# Patient Record
Sex: Female | Born: 1985
Health system: Southern US, Community
[De-identification: ages and names within clinical notes are randomized; demographics above are authoritative.]

## PROBLEM LIST (undated history)

## (undated) ENCOUNTER — Inpatient Hospital Stay (HOSPITAL_COMMUNITY): Payer: Self-pay

## (undated) DIAGNOSIS — F329 Major depressive disorder, single episode, unspecified: Secondary | ICD-10-CM

## (undated) DIAGNOSIS — N200 Calculus of kidney: Secondary | ICD-10-CM

## (undated) DIAGNOSIS — Z8619 Personal history of other infectious and parasitic diseases: Secondary | ICD-10-CM

## (undated) DIAGNOSIS — F419 Anxiety disorder, unspecified: Secondary | ICD-10-CM

## (undated) DIAGNOSIS — F32A Depression, unspecified: Secondary | ICD-10-CM

## (undated) DIAGNOSIS — Z8759 Personal history of other complications of pregnancy, childbirth and the puerperium: Secondary | ICD-10-CM

## (undated) HISTORY — PX: NO PAST SURGERIES: SHX2092

## (undated) HISTORY — DX: Personal history of other infectious and parasitic diseases: Z86.19

## (undated) HISTORY — DX: Anxiety disorder, unspecified: F41.9

## (undated) HISTORY — DX: Personal history of other complications of pregnancy, childbirth and the puerperium: Z87.59

---
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
## 2005-01-27 ENCOUNTER — Inpatient Hospital Stay: Admission: AD | Admit: 2005-01-27 | Discharge: 2005-01-27 | Payer: Self-pay | Admitting: Obstetrics

## 2005-03-26 ENCOUNTER — Inpatient Hospital Stay (HOSPITAL_COMMUNITY): Admission: AD | Admit: 2005-03-26 | Discharge: 2005-03-26 | Payer: Self-pay | Admitting: Obstetrics

## 2005-06-12 ENCOUNTER — Inpatient Hospital Stay (HOSPITAL_COMMUNITY): Admission: AD | Admit: 2005-06-12 | Discharge: 2005-06-12 | Payer: Self-pay | Admitting: Obstetrics

## 2005-06-18 ENCOUNTER — Inpatient Hospital Stay (HOSPITAL_COMMUNITY): Admission: AD | Admit: 2005-06-18 | Discharge: 2005-06-18 | Payer: Self-pay | Admitting: Obstetrics

## 2005-07-04 ENCOUNTER — Inpatient Hospital Stay (HOSPITAL_COMMUNITY): Admission: AD | Admit: 2005-07-04 | Discharge: 2005-07-05 | Payer: Self-pay | Admitting: Obstetrics

## 2005-07-28 ENCOUNTER — Inpatient Hospital Stay (HOSPITAL_COMMUNITY): Admission: AD | Admit: 2005-07-28 | Discharge: 2005-07-31 | Payer: Self-pay | Admitting: Obstetrics

## 2005-08-12 ENCOUNTER — Emergency Department (HOSPITAL_COMMUNITY): Admission: EM | Admit: 2005-08-12 | Discharge: 2005-08-12 | Payer: Self-pay | Admitting: Family Medicine

## 2006-10-16 ENCOUNTER — Inpatient Hospital Stay (HOSPITAL_COMMUNITY): Admission: AD | Admit: 2006-10-16 | Discharge: 2006-10-16 | Payer: Self-pay | Admitting: Obstetrics

## 2007-03-08 ENCOUNTER — Inpatient Hospital Stay (HOSPITAL_COMMUNITY): Admission: AD | Admit: 2007-03-08 | Discharge: 2007-03-09 | Payer: Self-pay | Admitting: Obstetrics

## 2007-03-09 ENCOUNTER — Inpatient Hospital Stay (HOSPITAL_COMMUNITY): Admission: AD | Admit: 2007-03-09 | Discharge: 2007-03-09 | Payer: Self-pay | Admitting: Obstetrics

## 2007-03-10 ENCOUNTER — Inpatient Hospital Stay (HOSPITAL_COMMUNITY): Admission: AD | Admit: 2007-03-10 | Discharge: 2007-03-13 | Payer: Self-pay | Admitting: Obstetrics

## 2007-05-16 ENCOUNTER — Emergency Department (HOSPITAL_COMMUNITY): Admission: EM | Admit: 2007-05-16 | Discharge: 2007-05-16 | Payer: Self-pay | Admitting: Emergency Medicine

## 2008-03-23 ENCOUNTER — Emergency Department (HOSPITAL_COMMUNITY): Admission: EM | Admit: 2008-03-23 | Discharge: 2008-03-23 | Payer: Self-pay | Admitting: Emergency Medicine

## 2008-11-23 ENCOUNTER — Ambulatory Visit: Payer: Self-pay | Admitting: Obstetrics and Gynecology

## 2008-11-23 ENCOUNTER — Inpatient Hospital Stay (HOSPITAL_COMMUNITY): Admission: AD | Admit: 2008-11-23 | Discharge: 2008-11-23 | Payer: Self-pay | Admitting: Obstetrics

## 2009-03-21 ENCOUNTER — Inpatient Hospital Stay (HOSPITAL_COMMUNITY): Admission: AD | Admit: 2009-03-21 | Discharge: 2009-03-21 | Payer: Self-pay | Admitting: Obstetrics

## 2009-07-26 ENCOUNTER — Emergency Department (HOSPITAL_COMMUNITY): Admission: EM | Admit: 2009-07-26 | Discharge: 2009-07-26 | Payer: Self-pay | Admitting: Emergency Medicine

## 2009-12-19 ENCOUNTER — Emergency Department (HOSPITAL_COMMUNITY): Admission: EM | Admit: 2009-12-19 | Discharge: 2009-12-20 | Payer: Self-pay | Admitting: Emergency Medicine

## 2010-02-26 ENCOUNTER — Emergency Department (HOSPITAL_COMMUNITY): Admission: EM | Admit: 2010-02-26 | Discharge: 2010-02-26 | Payer: Self-pay | Admitting: Emergency Medicine

## 2010-02-28 ENCOUNTER — Emergency Department (HOSPITAL_COMMUNITY): Admission: EM | Admit: 2010-02-28 | Discharge: 2010-02-28 | Payer: Self-pay | Admitting: Emergency Medicine

## 2010-03-01 ENCOUNTER — Emergency Department (HOSPITAL_COMMUNITY): Admission: EM | Admit: 2010-03-01 | Discharge: 2010-03-01 | Payer: Self-pay | Admitting: Emergency Medicine

## 2010-03-03 ENCOUNTER — Emergency Department (HOSPITAL_COMMUNITY): Admission: EM | Admit: 2010-03-03 | Discharge: 2010-03-03 | Payer: Self-pay | Admitting: Emergency Medicine

## 2010-03-06 ENCOUNTER — Emergency Department (HOSPITAL_COMMUNITY): Admission: EM | Admit: 2010-03-06 | Discharge: 2010-03-06 | Payer: Self-pay | Admitting: Emergency Medicine

## 2010-05-03 ENCOUNTER — Ambulatory Visit: Payer: Self-pay | Admitting: Gynecology

## 2010-05-03 ENCOUNTER — Inpatient Hospital Stay (HOSPITAL_COMMUNITY): Admission: AD | Admit: 2010-05-03 | Discharge: 2010-05-04 | Payer: Self-pay | Admitting: Obstetrics & Gynecology

## 2010-10-08 ENCOUNTER — Emergency Department (HOSPITAL_COMMUNITY)
Admission: EM | Admit: 2010-10-08 | Discharge: 2010-10-08 | Payer: Self-pay | Source: Home / Self Care | Admitting: Emergency Medicine

## 2010-11-13 ENCOUNTER — Inpatient Hospital Stay (HOSPITAL_COMMUNITY)
Admission: AD | Admit: 2010-11-13 | Discharge: 2010-11-13 | Payer: Self-pay | Source: Home / Self Care | Attending: Obstetrics & Gynecology | Admitting: Obstetrics & Gynecology

## 2010-11-14 LAB — CBC
Platelets: 320 10*3/uL (ref 150–400)
RBC: 3.54 MIL/uL — ABNORMAL LOW (ref 3.87–5.11)
WBC: 7.9 10*3/uL (ref 4.0–10.5)

## 2010-11-14 LAB — URINALYSIS, ROUTINE W REFLEX MICROSCOPIC
Nitrite: NEGATIVE
Protein, ur: NEGATIVE mg/dL
Urine Glucose, Fasting: NEGATIVE mg/dL
Urobilinogen, UA: 0.2 mg/dL (ref 0.0–1.0)

## 2010-11-14 LAB — DIFFERENTIAL
Basophils Absolute: 0 10*3/uL (ref 0.0–0.1)
Basophils Relative: 0 % (ref 0–1)
Eosinophils Absolute: 0 10*3/uL (ref 0.0–0.7)
Neutrophils Relative %: 63 % (ref 43–77)

## 2010-11-14 LAB — HEPATITIS B SURFACE ANTIGEN: Hepatitis B Surface Ag: NEGATIVE

## 2010-11-14 LAB — COMPREHENSIVE METABOLIC PANEL
AST: 22 U/L (ref 0–37)
Albumin: 2.4 g/dL — ABNORMAL LOW (ref 3.5–5.2)
Alkaline Phosphatase: 112 U/L (ref 39–117)
Chloride: 104 mEq/L (ref 96–112)
GFR calc Af Amer: >60 mL/min (ref >60)
Potassium: 3.9 mEq/L (ref 3.5–5.1)
Total Bilirubin: 0.4 mg/dL (ref 0.3–1.2)
Total Protein: 5.9 g/dL — ABNORMAL LOW (ref 6.0–8.3)

## 2010-11-14 LAB — RAPID URINE DRUG SCREEN, HOSP PERFORMED
Amphetamines: NOT DETECTED
Cocaine: POSITIVE — AB
Tetrahydrocannabinol: POSITIVE — AB

## 2010-11-14 LAB — RPR: RPR Ser Ql: NONREACTIVE

## 2010-11-14 LAB — GC/CHLAMYDIA PROBE AMP, GENITAL: GC Probe Amp, Genital: NEGATIVE

## 2010-11-14 LAB — WET PREP, GENITAL: Clue Cells Wet Prep HPF POC: NONE SEEN

## 2010-11-15 LAB — STREP B DNA PROBE: Strep Group B Ag: POSITIVE

## 2010-12-24 ENCOUNTER — Inpatient Hospital Stay (HOSPITAL_COMMUNITY)
Admission: AD | Admit: 2010-12-24 | Discharge: 2010-12-25 | Disposition: A | Discharge status: Home / Self Care | Payer: Medicaid Other | Source: Ambulatory Visit | Attending: Obstetrics & Gynecology | Admitting: Obstetrics & Gynecology

## 2010-12-24 DIAGNOSIS — O479 False labor, unspecified: Secondary | ICD-10-CM

## 2010-12-24 LAB — CBC
MCH: 29 pg (ref 26.0–34.0)
MCHC: 32.6 g/dL (ref 30.0–36.0)
Platelets: 313 10*3/uL (ref 150–400)
RBC: 4 MIL/uL (ref 3.87–5.11)

## 2010-12-24 LAB — DIFFERENTIAL
Basophils Absolute: 0 10*3/uL (ref 0.0–0.1)
Basophils Relative: 0 % (ref 0–1)
Eosinophils Absolute: 0.1 10*3/uL (ref 0.0–0.7)
Monocytes Relative: 7 % (ref 3–12)
Neutrophils Relative %: 57 % (ref 43–77)

## 2010-12-25 ENCOUNTER — Inpatient Hospital Stay (HOSPITAL_COMMUNITY): Payer: Medicaid Other

## 2010-12-25 ENCOUNTER — Inpatient Hospital Stay (HOSPITAL_COMMUNITY)
Admission: AD | Admit: 2010-12-25 | Discharge: 2010-12-28 | DRG: 775 | Disposition: A | Discharge status: Home / Self Care | Payer: Medicaid Other | Source: Ambulatory Visit | Attending: Obstetrics & Gynecology | Admitting: Obstetrics & Gynecology

## 2010-12-25 DIAGNOSIS — O99892 Other specified diseases and conditions complicating childbirth: Principal | ICD-10-CM | POA: Diagnosis present

## 2010-12-25 DIAGNOSIS — Z22330 Carrier of Group B streptococcus: Secondary | ICD-10-CM

## 2010-12-25 LAB — RAPID URINE DRUG SCREEN, HOSP PERFORMED
Amphetamines: NOT DETECTED
Amphetamines: NOT DETECTED
Cocaine: NOT DETECTED
Opiates: NOT DETECTED
Tetrahydrocannabinol: POSITIVE — AB
Tetrahydrocannabinol: POSITIVE — AB

## 2010-12-25 LAB — CBC
HCT: 36 % (ref 36.0–46.0)
Hemoglobin: 11.8 g/dL — ABNORMAL LOW (ref 12.0–15.0)
MCH: 28.8 pg (ref 26.0–34.0)
MCHC: 32.8 g/dL (ref 30.0–36.0)
MCV: 87.8 fL (ref 78.0–100.0)

## 2010-12-25 LAB — TYPE AND SCREEN
ABO/RH(D): A POS
Antibody Screen: NEGATIVE

## 2010-12-26 DIAGNOSIS — Z22330 Carrier of Group B streptococcus: Secondary | ICD-10-CM

## 2010-12-26 DIAGNOSIS — O9989 Other specified diseases and conditions complicating pregnancy, childbirth and the puerperium: Secondary | ICD-10-CM

## 2010-12-26 LAB — RPR: RPR Ser Ql: NONREACTIVE

## 2011-01-05 LAB — COMPREHENSIVE METABOLIC PANEL
AST: 52 U/L — ABNORMAL HIGH (ref 0–37)
Albumin: 4.1 g/dL (ref 3.5–5.2)
Chloride: 105 mEq/L (ref 96–112)
Creatinine, Ser: 0.71 mg/dL (ref 0.4–1.2)
GFR calc Af Amer: >60 mL/min (ref >60)
Potassium: 3.8 mEq/L (ref 3.5–5.1)
Total Bilirubin: 0.5 mg/dL (ref 0.3–1.2)

## 2011-01-05 LAB — CBC
MCH: 28.9 pg (ref 26.0–34.0)
Platelets: 201 10*3/uL (ref 150–400)
RBC: 4.22 MIL/uL (ref 3.87–5.11)
WBC: 8.8 10*3/uL (ref 4.0–10.5)

## 2011-01-06 LAB — URINALYSIS, ROUTINE W REFLEX MICROSCOPIC
Glucose, UA: NEGATIVE mg/dL
Ketones, ur: NEGATIVE mg/dL
pH: 5.5 (ref 5.0–8.0)

## 2011-01-06 LAB — POCT PREGNANCY, URINE: Preg Test, Ur: POSITIVE

## 2011-01-06 LAB — WET PREP, GENITAL
Clue Cells Wet Prep HPF POC: NONE SEEN
Trich, Wet Prep: NONE SEEN

## 2011-01-06 LAB — RAPID URINE DRUG SCREEN, HOSP PERFORMED
Barbiturates: NOT DETECTED
Benzodiazepines: NOT DETECTED
Cocaine: POSITIVE — AB

## 2011-01-07 LAB — DIFFERENTIAL
Basophils Relative: 0 % (ref 0–1)
Lymphs Abs: 2.5 10*3/uL (ref 0.7–4.0)
Monocytes Relative: 5 % (ref 3–12)
Neutro Abs: 3.6 10*3/uL (ref 1.7–7.7)
Neutrophils Relative %: 56 % (ref 43–77)

## 2011-01-07 LAB — BASIC METABOLIC PANEL
Calcium: 8.9 mg/dL (ref 8.4–10.5)
Creatinine, Ser: 0.83 mg/dL (ref 0.4–1.2)
GFR calc Af Amer: >60 mL/min (ref >60)
GFR calc non Af Amer: >60 mL/min (ref >60)

## 2011-01-07 LAB — RAPID URINE DRUG SCREEN, HOSP PERFORMED
Amphetamines: NOT DETECTED
Barbiturates: NOT DETECTED
Cocaine: POSITIVE — AB
Opiates: POSITIVE — AB
Tetrahydrocannabinol: POSITIVE — AB

## 2011-01-07 LAB — POCT PREGNANCY, URINE: Preg Test, Ur: NEGATIVE

## 2011-01-07 LAB — TRICYCLICS SCREEN, URINE: TCA Scrn: NOT DETECTED

## 2011-01-07 LAB — ETHANOL: Alcohol, Ethyl (B): <5 mg/dL (ref 0–10)

## 2011-01-07 LAB — CBC
RBC: 4.39 MIL/uL (ref 3.87–5.11)
WBC: 6.5 10*3/uL (ref 4.0–10.5)

## 2011-01-08 LAB — BASIC METABOLIC PANEL
CO2: 27 mEq/L (ref 19–32)
CO2: 29 mEq/L (ref 19–32)
Chloride: 102 mEq/L (ref 96–112)
Chloride: 106 mEq/L (ref 96–112)
GFR calc Af Amer: >60 mL/min (ref >60)
GFR calc Af Amer: >60 mL/min (ref >60)
Potassium: 3.6 mEq/L (ref 3.5–5.1)
Sodium: 135 mEq/L (ref 135–145)
Sodium: 139 mEq/L (ref 135–145)

## 2011-01-08 LAB — URINALYSIS, ROUTINE W REFLEX MICROSCOPIC
Bilirubin Urine: NEGATIVE
Glucose, UA: NEGATIVE mg/dL
Hgb urine dipstick: NEGATIVE
Nitrite: NEGATIVE
Nitrite: POSITIVE — AB
Protein, ur: NEGATIVE mg/dL
Specific Gravity, Urine: 1.011 (ref 1.005–1.030)
Specific Gravity, Urine: 1.024 (ref 1.005–1.030)
Urobilinogen, UA: 2 mg/dL — ABNORMAL HIGH (ref 0.0–1.0)
pH: 7 (ref 5.0–8.0)
pH: 7 (ref 5.0–8.0)

## 2011-01-08 LAB — CBC
HCT: 39 % (ref 36.0–46.0)
Hemoglobin: 12.9 g/dL (ref 12.0–15.0)
Hemoglobin: 13.1 g/dL (ref 12.0–15.0)
MCHC: 33.7 g/dL (ref 30.0–36.0)
MCHC: 33.9 g/dL (ref 30.0–36.0)
MCV: 84.8 fL (ref 78.0–100.0)
MCV: 85.3 fL (ref 78.0–100.0)
RBC: 4.47 MIL/uL (ref 3.87–5.11)
RBC: 4.57 MIL/uL (ref 3.87–5.11)
WBC: 9 10*3/uL (ref 4.0–10.5)

## 2011-01-08 LAB — RAPID URINE DRUG SCREEN, HOSP PERFORMED
Amphetamines: NOT DETECTED
Barbiturates: NOT DETECTED
Barbiturates: NOT DETECTED
Benzodiazepines: NOT DETECTED
Benzodiazepines: POSITIVE — AB
Cocaine: POSITIVE — AB
Cocaine: POSITIVE — AB
Cocaine: POSITIVE — AB
Opiates: POSITIVE — AB
Tetrahydrocannabinol: POSITIVE — AB

## 2011-01-08 LAB — COMPREHENSIVE METABOLIC PANEL
AST: 37 U/L (ref 0–37)
Albumin: 4 g/dL (ref 3.5–5.2)
BUN: 7 mg/dL (ref 6–23)
Calcium: 9.6 mg/dL (ref 8.4–10.5)
Creatinine, Ser: 0.86 mg/dL (ref 0.4–1.2)
GFR calc Af Amer: >60 mL/min (ref >60)
Total Bilirubin: 0.5 mg/dL (ref 0.3–1.2)
Total Protein: 8.2 g/dL (ref 6.0–8.3)

## 2011-01-08 LAB — HEPATIC FUNCTION PANEL
ALT: 73 U/L — ABNORMAL HIGH (ref 0–35)
Alkaline Phosphatase: 76 U/L (ref 39–117)
Bilirubin, Direct: <0.1 mg/dL (ref 0.0–0.3)
Total Protein: 7.3 g/dL (ref 6.0–8.3)

## 2011-01-08 LAB — DIFFERENTIAL
Basophils Relative: 1 % (ref 0–1)
Eosinophils Relative: 2 % (ref 0–5)
Lymphs Abs: 3.5 10*3/uL (ref 0.7–4.0)
Monocytes Absolute: 0.5 10*3/uL (ref 0.1–1.0)

## 2011-01-08 LAB — POCT PREGNANCY, URINE
Preg Test, Ur: NEGATIVE
Preg Test, Ur: NEGATIVE

## 2011-01-08 LAB — WET PREP, GENITAL: Yeast Wet Prep HPF POC: NONE SEEN

## 2011-01-08 LAB — ETHANOL
Alcohol, Ethyl (B): <5 mg/dL (ref 0–10)
Alcohol, Ethyl (B): <5 mg/dL (ref 0–10)

## 2011-01-08 LAB — URINE MICROSCOPIC-ADD ON

## 2011-01-13 LAB — DIFFERENTIAL
Lymphocytes Relative: 46 % (ref 12–46)
Lymphs Abs: 3.6 10*3/uL (ref 0.7–4.0)
Neutrophils Relative %: 44 % (ref 43–77)

## 2011-01-13 LAB — URINALYSIS, ROUTINE W REFLEX MICROSCOPIC
Glucose, UA: NEGATIVE mg/dL
Ketones, ur: NEGATIVE mg/dL
Nitrite: NEGATIVE
Specific Gravity, Urine: 1.025 (ref 1.005–1.030)
pH: 6 (ref 5.0–8.0)

## 2011-01-13 LAB — RAPID URINE DRUG SCREEN, HOSP PERFORMED
Cocaine: POSITIVE — AB
Tetrahydrocannabinol: POSITIVE — AB

## 2011-01-13 LAB — ETHANOL: Alcohol, Ethyl (B): <5 mg/dL (ref 0–10)

## 2011-01-13 LAB — CBC
Platelets: 221 10*3/uL (ref 150–400)
RBC: 4.41 MIL/uL (ref 3.87–5.11)
WBC: 7.8 10*3/uL (ref 4.0–10.5)

## 2011-01-13 LAB — URINE MICROSCOPIC-ADD ON

## 2011-01-13 LAB — BASIC METABOLIC PANEL
BUN: 14 mg/dL (ref 6–23)
Creatinine, Ser: 0.87 mg/dL (ref 0.4–1.2)
GFR calc Af Amer: >60 mL/min (ref >60)
GFR calc non Af Amer: >60 mL/min (ref >60)
Potassium: 3.7 mEq/L (ref 3.5–5.1)

## 2011-01-13 LAB — POCT PREGNANCY, URINE: Preg Test, Ur: NEGATIVE

## 2011-01-21 ENCOUNTER — Encounter: Payer: Medicaid Other | Admitting: Obstetrics & Gynecology

## 2011-01-22 LAB — COMPREHENSIVE METABOLIC PANEL
ALT: 29 U/L (ref 0–35)
AST: 27 U/L (ref 0–37)
Albumin: 2.6 g/dL — ABNORMAL LOW (ref 3.5–5.2)
Alkaline Phosphatase: 153 U/L — ABNORMAL HIGH (ref 39–117)
BUN: 6 mg/dL (ref 6–23)
Chloride: 103 mEq/L (ref 96–112)
GFR calc Af Amer: >60 mL/min (ref >60)
Potassium: 3.5 mEq/L (ref 3.5–5.1)
Sodium: 133 mEq/L — ABNORMAL LOW (ref 135–145)
Total Bilirubin: 0.6 mg/dL (ref 0.3–1.2)
Total Protein: 6.5 g/dL (ref 6.0–8.3)

## 2011-01-22 LAB — URINALYSIS, ROUTINE W REFLEX MICROSCOPIC
Hgb urine dipstick: NEGATIVE
Protein, ur: NEGATIVE mg/dL
Specific Gravity, Urine: 1.025 (ref 1.005–1.030)
Urobilinogen, UA: 0.2 mg/dL (ref 0.0–1.0)

## 2011-01-22 LAB — CBC
MCV: 89 fL (ref 78.0–100.0)
Platelets: 315 10*3/uL (ref 150–400)
RBC: 4.01 MIL/uL (ref 3.87–5.11)
RDW: 13.8 % (ref 11.5–15.5)
WBC: 10.8 10*3/uL — ABNORMAL HIGH (ref 4.0–10.5)

## 2011-01-22 LAB — PROTEIN / CREATININE RATIO, URINE: Creatinine, Urine: 124 mg/dL

## 2011-01-28 LAB — URINALYSIS, ROUTINE W REFLEX MICROSCOPIC
Glucose, UA: NEGATIVE mg/dL
Specific Gravity, Urine: 1.01 (ref 1.005–1.030)
pH: 7 (ref 5.0–8.0)

## 2011-01-28 LAB — GC/CHLAMYDIA PROBE AMP, GENITAL
Chlamydia, DNA Probe: NEGATIVE
GC Probe Amp, Genital: NEGATIVE

## 2011-01-28 LAB — WET PREP, GENITAL
Trich, Wet Prep: NONE SEEN
Yeast Wet Prep HPF POC: NONE SEEN

## 2011-02-05 LAB — CBC
HCT: 35.8 % — ABNORMAL LOW (ref 36.0–46.0)
MCV: 86.9 fL (ref 78.0–100.0)
Platelets: 173 10*3/uL (ref 150–400)
RDW: 13.7 % (ref 11.5–15.5)
WBC: 5.8 10*3/uL (ref 4.0–10.5)

## 2011-04-04 ENCOUNTER — Emergency Department (HOSPITAL_COMMUNITY)
Admission: EM | Admit: 2011-04-04 | Discharge: 2011-04-04 | Payer: Medicaid Other | Attending: Emergency Medicine | Admitting: Emergency Medicine

## 2011-04-04 ENCOUNTER — Emergency Department (HOSPITAL_COMMUNITY): Payer: Medicaid Other

## 2011-04-04 DIAGNOSIS — S335XXA Sprain of ligaments of lumbar spine, initial encounter: Secondary | ICD-10-CM | POA: Insufficient documentation

## 2011-04-04 DIAGNOSIS — Y92410 Unspecified street and highway as the place of occurrence of the external cause: Secondary | ICD-10-CM | POA: Insufficient documentation

## 2011-04-04 DIAGNOSIS — S139XXA Sprain of joints and ligaments of unspecified parts of neck, initial encounter: Secondary | ICD-10-CM | POA: Insufficient documentation

## 2011-04-04 DIAGNOSIS — IMO0002 Reserved for concepts with insufficient information to code with codable children: Secondary | ICD-10-CM | POA: Insufficient documentation

## 2011-04-04 DIAGNOSIS — N390 Urinary tract infection, site not specified: Secondary | ICD-10-CM | POA: Insufficient documentation

## 2011-04-04 LAB — URINE MICROSCOPIC-ADD ON

## 2011-04-04 LAB — URINALYSIS, ROUTINE W REFLEX MICROSCOPIC
Glucose, UA: NEGATIVE mg/dL
pH: 5.5 (ref 5.0–8.0)

## 2011-04-06 LAB — URINE CULTURE: Culture  Setup Time: 201206142116

## 2011-07-18 LAB — POCT RAPID STREP A: Streptococcus, Group A Screen (Direct): NEGATIVE

## 2012-11-10 IMAGING — US US OB COMP +14 WK
2 series · 12 of 28 positions shown · non-contrast
Comparison: none

[Series 1: us ob comp +14 wk · 2 of 7 slices shown (1 of 2)]
[im 3/7]
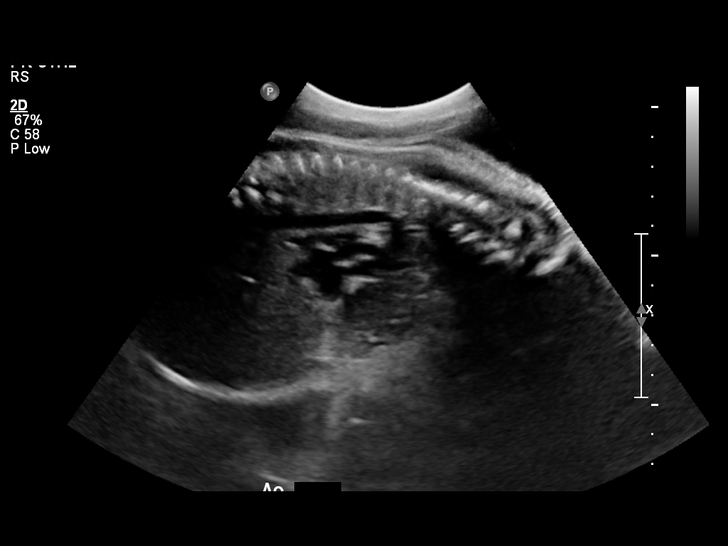
[im 7/7]
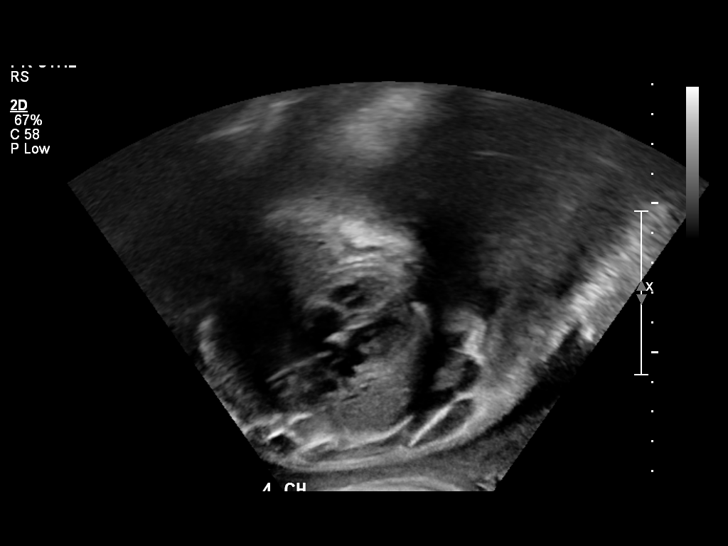

[Series 1: us ob comp +14 wk · 10 of 45 slices shown (2 of 2)]
[im 2/45]
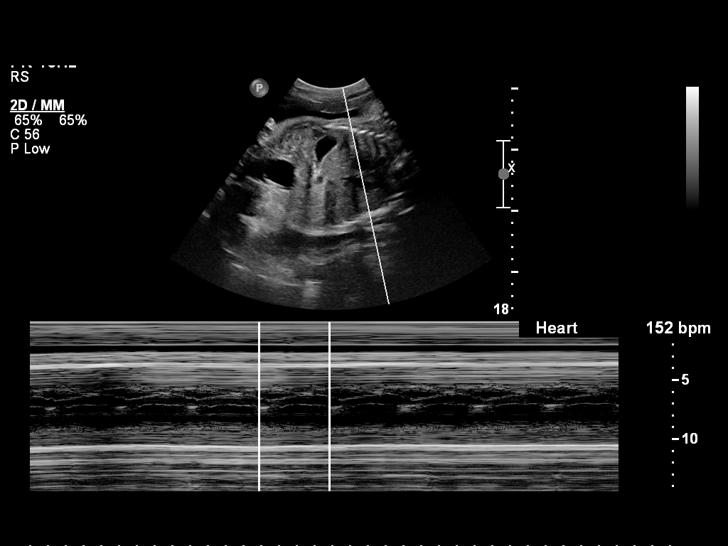
[im 8/45]
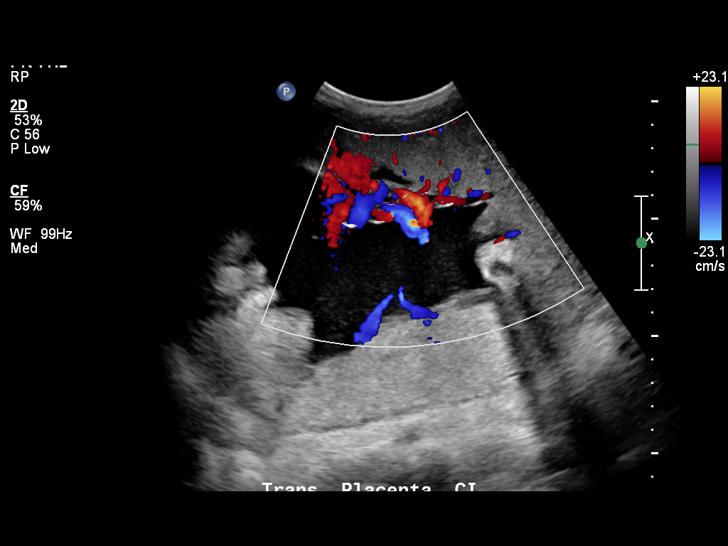
[im 12/45]
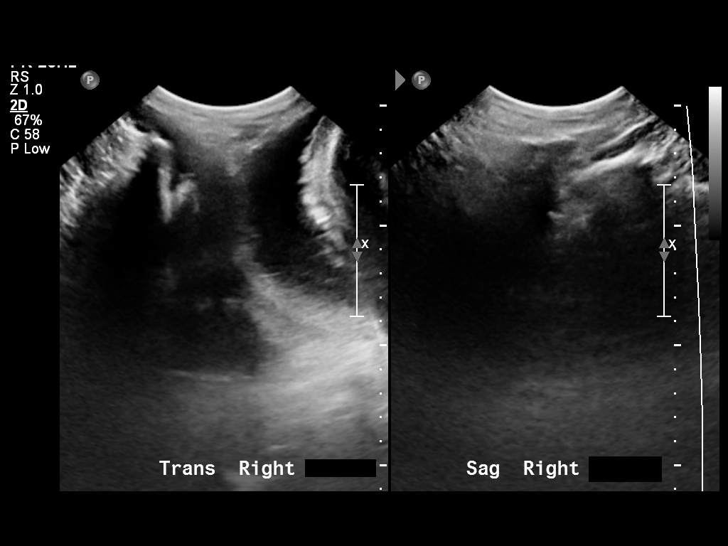
[im 16/45]
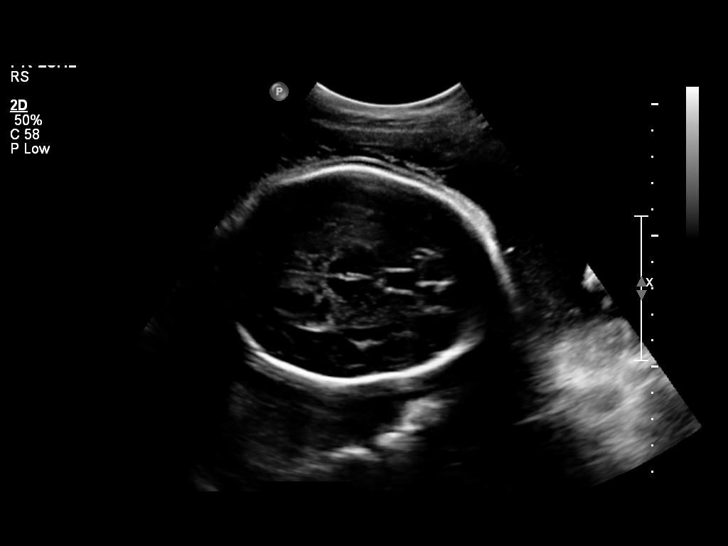
[im 22/45]
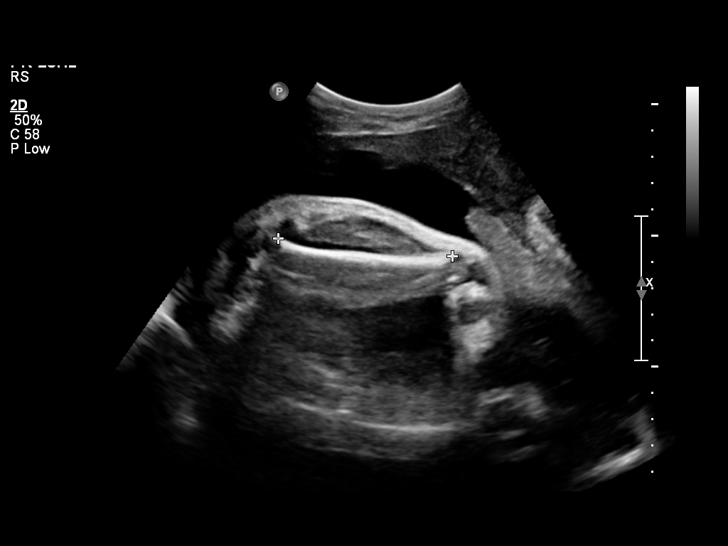
[im 25/45]
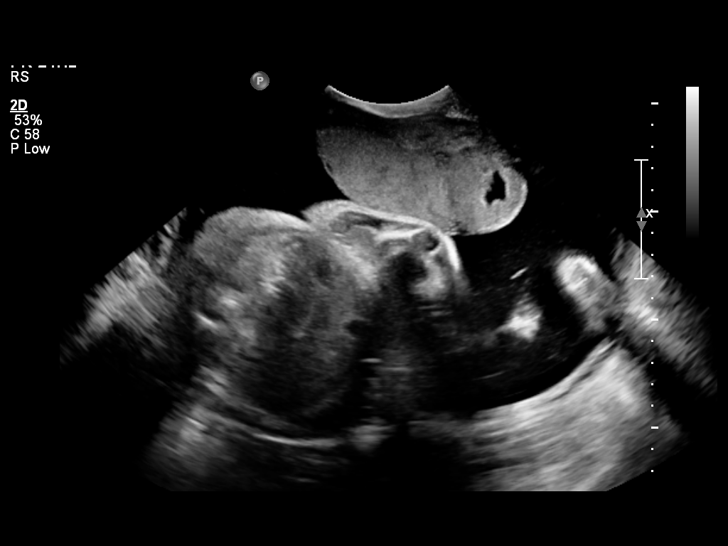
[im 29/45]
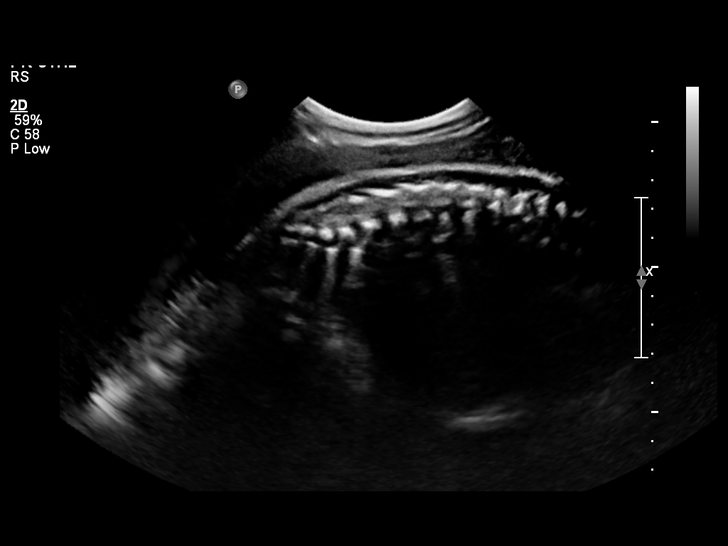
[im 35/45]
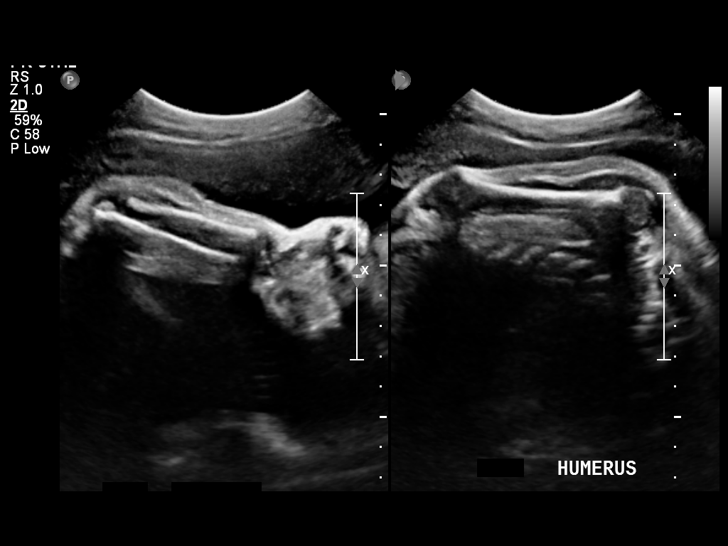
[im 39/45]
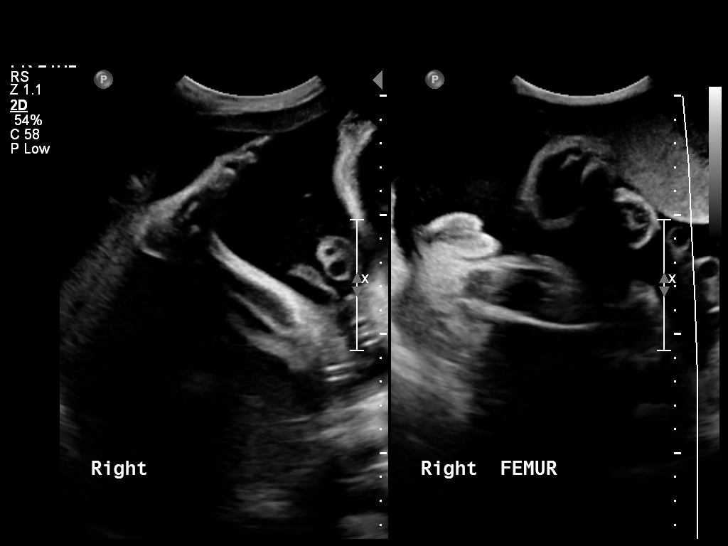
[im 43/45]
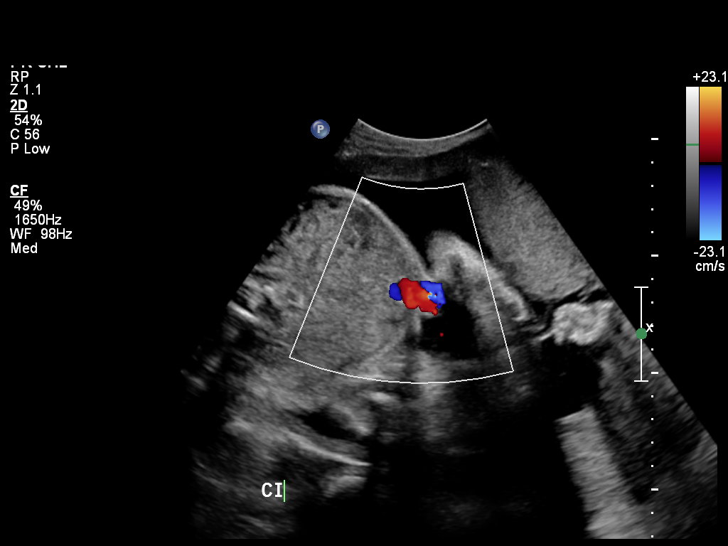

[12 of 28 positions shown; findings below may reference images not displayed]

OBSTETRICS REPORT
                      (Signed Final 11/13/2010 [DATE])

Procedures

 US OB COMP +14 WK                                     76805.1
Indications

 Contracting
 No or Little Prenatal Care
 Routine fetal anatomic survey
Fetal Evaluation

 Fetal Heart Rate:  152                          bpm
 Cardiac Activity:  Observed
 Presentation:      Cephalic
 Placenta:          Posterior Fundal, above
                    cervical os
 P. Cord            Visualized
 Insertion:

 Amniotic Fluid
 AFI FV:      Subjectively within normal limits
 AFI Sum:     22.46   cm       85  %Tile     Larg Pckt:    6.56  cm
 RUQ:   6.02    cm   RLQ:    4.61   cm    LUQ:   6.56    cm   LLQ:    5.27   cm
Biometry

 BPD:       82  mm     G. Age:  33w 0d                CI:        68.57   70 - 86
                                                      FL/HC:      20.8   19.4 -

 HC:     316.5  mm     G. Age:  35w 4d       50  %    HC/AC:      1.04   0.96 -

 AC:     303.7  mm     G. Age:  34w 3d       59  %    FL/BPD:     80.4   71 - 87
 FL:      65.9  mm     G. Age:  34w 0d       35  %    FL/AC:      21.7   20 - 24

 Est. FW:    0450  gm      5 lb 4 oz     61  %
Gestational Age

 LMP:           34w 1d        Date:  03/19/10                 EDD:   12/24/10
 U/S Today:     34w 2d                                        EDD:   12/23/10
 Best:          34w 1d     Det. By:  LMP  (03/19/10)          EDD:   12/24/10
Anatomy
 Cranium:           Appears normal      Aortic Arch:       Appears normal
 Fetal Cavum:       Appears normal      Ductal Arch:       Not well
                                                           visualized
 Ventricles:        Appears normal      Diaphragm:         Appears normal
 Choroid Plexus:    Appears normal      Stomach:           Appears
                                                           normal, left
                                                           sided
 Cerebellum:        Appears normal      Abdomen:           Appears normal
 Posterior Fossa:   Appears normal      Abdominal Wall:    Appears nml
                                                           (cord insert,
                                                           abd wall)
 Nuchal Fold:       Not applicable      Cord Vessels:      Appears normal
                    (>20 wks GA)                           (3 vessel cord)
 Face:              Lips appear         Kidneys:           Appear normal
                    normal
 Heart:             Appears normal      Bladder:           Appears normal
                    (4 chamber &
                    axis)
 RVOT:              Appears normal      Spine:             Appears normal
 LVOT:              Not well            Limbs:             Appears normal
                    visualized                             (hands, ankles,
                                                           feet)

 Other:     Female gender. Heels visualized. Nasal bone visualized.
Cervix Uterus Adnexa

 Cervical Length:    3.32     cm

 Cervix:       Closed.

 Adnexa:     No abnormality visualized.
Impression

   Single living intrauterine gestation in cephalic presentation
 with concordant gestational age,  fetal indices, and normal
 visualized anatomy.

## 2012-11-19 ENCOUNTER — Emergency Department (HOSPITAL_COMMUNITY)
Admission: EM | Admit: 2012-11-19 | Discharge: 2012-11-19 | Disposition: A | Discharge status: Home / Self Care | Payer: Self-pay

## 2012-11-19 ENCOUNTER — Encounter (HOSPITAL_COMMUNITY): Payer: Self-pay

## 2012-11-19 HISTORY — DX: Calculus of kidney: N20.0

## 2012-11-19 NOTE — ED Notes (Signed)
Patient reports that she began having dysuria symptoms a week ago and is now having lower abdomina, frequency and lower back pain too.

## 2013-02-27 ENCOUNTER — Emergency Department (HOSPITAL_COMMUNITY)
Admission: EM | Admit: 2013-02-27 | Discharge: 2013-02-27 | Discharge status: Against Medical Advice | Payer: Self-pay | Attending: Emergency Medicine | Admitting: Emergency Medicine

## 2013-02-27 ENCOUNTER — Emergency Department (HOSPITAL_COMMUNITY): Payer: Self-pay

## 2013-02-27 ENCOUNTER — Encounter (HOSPITAL_COMMUNITY): Payer: Self-pay | Admitting: *Deleted

## 2013-02-27 DIAGNOSIS — Y939 Activity, unspecified: Secondary | ICD-10-CM | POA: Insufficient documentation

## 2013-02-27 DIAGNOSIS — W1809XA Striking against other object with subsequent fall, initial encounter: Secondary | ICD-10-CM | POA: Insufficient documentation

## 2013-02-27 DIAGNOSIS — W11XXXA Fall on and from ladder, initial encounter: Secondary | ICD-10-CM | POA: Insufficient documentation

## 2013-02-27 DIAGNOSIS — IMO0002 Reserved for concepts with insufficient information to code with codable children: Secondary | ICD-10-CM | POA: Insufficient documentation

## 2013-02-27 DIAGNOSIS — Y929 Unspecified place or not applicable: Secondary | ICD-10-CM | POA: Insufficient documentation

## 2013-02-27 NOTE — ED Notes (Signed)
Patient called no answer.

## 2013-02-27 NOTE — ED Notes (Signed)
Unable to locate patient x3.

## 2013-02-27 NOTE — ED Notes (Signed)
Pt in s/p fall off ladder yesterday approx 3 ft fall, c/o mid and lower back pain, states she hit her head, possible LOC. Pt c/o mild dizziness since that time. Pt ambulatory without difficulty.

## 2013-10-21 NOTE — L&D Delivery Note (Signed)
Delivery Note Pt reached complete dilation and pushed great.  At 4:54 PM a healthy female was delivered via vertex (Presentation:LOA).  APGAR: 8, 9; weight pending .   Placenta status:delivered spontaneously , .  Cord:  with the following complications:  Tight nuchal delivered through .  Anesthesia:  epidural Episiotomy:  none Lacerations:  none Suture Repair: n/a Est. Blood Loss (mL):  300cc  Mom to postpartum.  Baby to Couplet care / Skin to Skin. D/w pt circumcision and desires to do in office. Oliver PilaRICHARDSON,Aynslee Mulhall W 09/07/2014, 5:12 PM

## 2014-05-09 LAB — OB RESULTS CONSOLE GC/CHLAMYDIA
CHLAMYDIA, DNA PROBE: NEGATIVE
Gonorrhea: NEGATIVE

## 2014-05-09 LAB — OB RESULTS CONSOLE HEPATITIS B SURFACE ANTIGEN: HEP B S AG: NEGATIVE

## 2014-05-09 LAB — OB RESULTS CONSOLE ABO/RH: RH TYPE: POSITIVE

## 2014-05-09 LAB — OB RESULTS CONSOLE RUBELLA ANTIBODY, IGM: RUBELLA: IMMUNE

## 2014-05-09 LAB — OB RESULTS CONSOLE RPR: RPR: NONREACTIVE

## 2014-05-09 LAB — OB RESULTS CONSOLE HIV ANTIBODY (ROUTINE TESTING): HIV: NONREACTIVE

## 2014-08-11 LAB — OB RESULTS CONSOLE GBS: GBS: NEGATIVE

## 2014-08-13 ENCOUNTER — Encounter (HOSPITAL_COMMUNITY): Payer: Self-pay | Admitting: *Deleted

## 2014-08-13 ENCOUNTER — Inpatient Hospital Stay (HOSPITAL_COMMUNITY)
Admission: AD | Admit: 2014-08-13 | Discharge: 2014-08-14 | Disposition: A | Discharge status: Home / Self Care | Payer: Medicaid Other | Source: Ambulatory Visit | Attending: Obstetrics and Gynecology | Admitting: Obstetrics and Gynecology

## 2014-08-13 DIAGNOSIS — Z3A36 36 weeks gestation of pregnancy: Secondary | ICD-10-CM | POA: Insufficient documentation

## 2014-08-13 HISTORY — DX: Depression, unspecified: F32.A

## 2014-08-13 HISTORY — DX: Major depressive disorder, single episode, unspecified: F32.9

## 2014-08-13 NOTE — MAU Note (Signed)
Contractions for couple days. Stronger last couple hours. Denies leaking fld or bleeding. Mentioned baby has not moved much today

## 2014-08-14 ENCOUNTER — Encounter (HOSPITAL_COMMUNITY): Payer: Self-pay

## 2014-08-14 DIAGNOSIS — Z3A36 36 weeks gestation of pregnancy: Secondary | ICD-10-CM | POA: Diagnosis not present

## 2014-08-14 NOTE — Discharge Instructions (Signed)
Braxton Hicks Contractions Contractions of the uterus can occur throughout pregnancy. Contractions are not always a sign that you are in labor.  WHAT ARE BRAXTON HICKS CONTRACTIONS?  Contractions that occur before labor are called Braxton Hicks contractions, or false labor. Toward the end of pregnancy (32-34 weeks), these contractions can develop more often and may become more forceful. This is not true labor because these contractions do not result in opening (dilatation) and thinning of the cervix. They are sometimes difficult to tell apart from true labor because these contractions can be forceful and people have different pain tolerances. You should not feel embarrassed if you go to the hospital with false labor. Sometimes, the only way to tell if you are in true labor is for your health care provider to look for changes in the cervix. If there are no prenatal problems or other health problems associated with the pregnancy, it is completely safe to be sent home with false labor and await the onset of true labor. HOW CAN YOU TELL THE DIFFERENCE BETWEEN TRUE AND FALSE LABOR? False Labor  The contractions of false labor are usually shorter and not as hard as those of true labor.   The contractions are usually irregular.   The contractions are often felt in the front of the lower abdomen and in the groin.   The contractions may go away when you walk around or change positions while lying down.   The contractions get weaker and are shorter lasting as time goes on.   The contractions do not usually become progressively stronger, regular, and closer together as with true labor.  True Labor  Contractions in true labor last 30-70 seconds, become very regular, usually become more intense, and increase in frequency.   The contractions do not go away with walking.   The discomfort is usually felt in the top of the uterus and spreads to the lower abdomen and low back.   True labor can be  determined by your health care provider with an exam. This will show that the cervix is dilating and getting thinner.  WHAT TO REMEMBER  Keep up with your usual exercises and follow other instructions given by your health care provider.   Take medicines as directed by your health care provider.   Keep your regular prenatal appointments.   Eat and drink lightly if you think you are going into labor.   If Braxton Hicks contractions are making you uncomfortable:   Change your position from lying down or resting to walking, or from walking to resting.   Sit and rest in a tub of warm water.   Drink 2-3 glasses of water. Dehydration may cause these contractions.   Do slow and deep breathing several times an hour.  WHEN SHOULD I SEEK IMMEDIATE MEDICAL CARE? Seek immediate medical care if:  Your contractions become stronger, more regular, and closer together.   You have fluid leaking or gushing from your vagina.   You have a fever.   You pass blood-tinged mucus.   You have vaginal bleeding.   You have continuous abdominal pain.   You have low back pain that you never had before.   You feel your baby's head pushing down and causing pelvic pressure.   Your baby is not moving as much as it used to.  Document Released: 10/07/2005 Document Revised: 10/12/2013 Document Reviewed: 07/19/2013 [employer] Patient Information 2015 [employer]. This information is not intended to replace advice given to you by your health care   provider. Make sure you discuss any questions you have with your health care provider.  Fetal Movement Counts Patient Name: __________________________________________________ Patient Due Date: ____________________ Performing a fetal movement count is highly recommended in high-risk pregnancies, but it is good for every pregnant woman to do. Your health care provider may ask you to start counting fetal movements at 28 weeks of the pregnancy. Fetal  movements often increase:  After eating a full meal.  After physical activity.  After eating or drinking something sweet or cold.  At rest. Pay attention to when you feel the baby is most active. This will help you notice a pattern of your baby's sleep and wake cycles and what factors contribute to an increase in fetal movement. It is important to perform a fetal movement count at the same time each day when your baby is normally most active.  HOW TO COUNT FETAL MOVEMENTS 1. Find a quiet and comfortable area to sit or lie down on your left side. Lying on your left side provides the best blood and oxygen circulation to your baby. 2. Write down the day and time on a sheet of paper or in a journal. 3. Start counting kicks, flutters, swishes, rolls, or jabs in a 2-hour period. You should feel at least 10 movements within 2 hours. 4. If you do not feel 10 movements in 2 hours, wait 2-3 hours and count again. Look for a change in the pattern or not enough counts in 2 hours. SEEK MEDICAL CARE IF:  You feel less than 10 counts in 2 hours, tried twice.  There is no movement in over an hour.  The pattern is changing or taking longer each day to reach 10 counts in 2 hours.  You feel the baby is not moving as he or she usually does. Date: ____________ Movements: ____________ Start time: ____________ Finish time: ____________  Date: ____________ Movements: ____________ Start time: ____________ Finish time: ____________ Date: ____________ Movements: ____________ Start time: ____________ Finish time: ____________ Date: ____________ Movements: ____________ Start time: ____________ Finish time: ____________ Date: ____________ Movements: ____________ Start time: ____________ Finish time: ____________ Date: ____________ Movements: ____________ Start time: ____________ Finish time: ____________ Date: ____________ Movements: ____________ Start time: ____________ Finish time: ____________ Date: ____________  Movements: ____________ Start time: ____________ Finish time: ____________  Date: ____________ Movements: ____________ Start time: ____________ Finish time: ____________ Date: ____________ Movements: ____________ Start time: ____________ Finish time: ____________ Date: ____________ Movements: ____________ Start time: ____________ Finish time: ____________ Date: ____________ Movements: ____________ Start time: ____________ Finish time: ____________ Date: ____________ Movements: ____________ Start time: ____________ Finish time: ____________ Date: ____________ Movements: ____________ Start time: ____________ Finish time: ____________ Date: ____________ Movements: ____________ Start time: ____________ Finish time: ____________  Date: ____________ Movements: ____________ Start time: ____________ Finish time: ____________ Date: ____________ Movements: ____________ Start time: ____________ Finish time: ____________ Date: ____________ Movements: ____________ Start time: ____________ Finish time: ____________ Date: ____________ Movements: ____________ Start time: ____________ Finish time: ____________ Date: ____________ Movements: ____________ Start time: ____________ Finish time: ____________ Date: ____________ Movements: ____________ Start time: ____________ Finish time: ____________ Date: ____________ Movements: ____________ Start time: ____________ Finish time: ____________  Date: ____________ Movements: ____________ Start time: ____________ Finish time: ____________ Date: ____________ Movements: ____________ Start time: ____________ Finish time: ____________ Date: ____________ Movements: ____________ Start time: ____________ Finish time: ____________ Date: ____________ Movements: ____________ Start time: ____________ Finish time: ____________ Date: ____________ Movements: ____________ Start time: ____________ Finish time: ____________ Date: ____________ Movements: ____________ Start time:  ____________ Finish time: ____________ Date: ____________ Movements:   ____________ Start time: ____________ Finish time: ____________  Date: ____________ Movements: ____________ Start time: ____________ Finish time: ____________ Date: ____________ Movements: ____________ Start time: ____________ Finish time: ____________ Date: ____________ Movements: ____________ Start time: ____________ Finish time: ____________ Date: ____________ Movements: ____________ Start time: ____________ Finish time: ____________ Date: ____________ Movements: ____________ Start time: ____________ Finish time: ____________ Date: ____________ Movements: ____________ Start time: ____________ Finish time: ____________ Date: ____________ Movements: ____________ Start time: ____________ Finish time: ____________  Date: ____________ Movements: ____________ Start time: ____________ Finish time: ____________ Date: ____________ Movements: ____________ Start time: ____________ Finish time: ____________ Date: ____________ Movements: ____________ Start time: ____________ Finish time: ____________ Date: ____________ Movements: ____________ Start time: ____________ Finish time: ____________ Date: ____________ Movements: ____________ Start time: ____________ Finish time: ____________ Date: ____________ Movements: ____________ Start time: ____________ Finish time: ____________ Date: ____________ Movements: ____________ Start time: ____________ Finish time: ____________  Date: ____________ Movements: ____________ Start time: ____________ Finish time: ____________ Date: ____________ Movements: ____________ Start time: ____________ Finish time: ____________ Date: ____________ Movements: ____________ Start time: ____________ Finish time: ____________ Date: ____________ Movements: ____________ Start time: ____________ Finish time: ____________ Date: ____________ Movements: ____________ Start time: ____________ Finish time: ____________ Date:  ____________ Movements: ____________ Start time: ____________ Finish time: ____________ Date: ____________ Movements: ____________ Start time: ____________ Finish time: ____________  Date: ____________ Movements: ____________ Start time: ____________ Finish time: ____________ Date: ____________ Movements: ____________ Start time: ____________ Finish time: ____________ Date: ____________ Movements: ____________ Start time: ____________ Finish time: ____________ Date: ____________ Movements: ____________ Start time: ____________ Finish time: ____________ Date: ____________ Movements: ____________ Start time: ____________ Finish time: ____________ Date: ____________ Movements: ____________ Start time: ____________ Finish time: ____________ Document Released: 11/06/2006 Document Revised: 02/21/2014 Document Reviewed: 08/03/2012 [employer] Patient Information 2015 [employer]. This information is not intended to replace advice given to you by your health care provider. Make sure you discuss any questions you have with your health care provider.  

## 2014-08-22 ENCOUNTER — Encounter (HOSPITAL_COMMUNITY): Payer: Self-pay

## 2014-09-05 ENCOUNTER — Encounter (HOSPITAL_COMMUNITY): Payer: Self-pay | Admitting: *Deleted

## 2014-09-05 ENCOUNTER — Telehealth (HOSPITAL_COMMUNITY): Payer: Self-pay | Admitting: *Deleted

## 2014-09-05 NOTE — Telephone Encounter (Signed)
Preadmission screen  

## 2014-09-06 NOTE — H&P (Signed)
Sonya Flores is a 28 y.o. female G5P4004 at 1939 2/7 weeks (EDD 09/12/14 by LMP c/w 15 week US) presenting for IOL at term with favorable cervix. Prenatal care started late about 15 weeks and pt admitted to a history of alcohol abuse just prior to finding out she was pregnant.  She also had a h/o drug abuse, but not in last year.  Her other children were adopted out because of her drug issue and she sees them but does not raise them.  She quit alcohol and smoking with this pregnancy and very much desires this child.  She has been motivated and states no recent drug use.  She had a lot of anxiety and irritability early in pregnancy and has responded well to zoloft after we discussed risks and benefits of this.   She missed appointments from 27 to 34 weeks when [place] had to go to [place] when her aunt and aunt's husband were killed in an MVA.  They have a 28y/o and 28 y/o that she and her mother are hoping to adopt.  It has been very stressful, but she states she and husband have not been drinking or using drugs.  Maternal Medical History:  Contractions: Frequency: irregular.   Perceived severity is mild.    Fetal activity: Perceived fetal activity is normal.    Prenatal Complications - Diabetes: none.    OB History    Gravida Para Term Preterm AB TAB SAB Ectopic Multiple Living   5 4 4  0 0 0 0 0 0 4    2005 6#2oz NSVD 2006 6#5oz NSVD 2008 6#  NSVD 2012 6# NSVD  Past Medical History  Diagnosis Date  . Kidney stone   . Depression   . History of shingles    Past Surgical History  Procedure Laterality Date  . No past surgeries     Family History: family history is not on file. Social History:  reports that she quit smoking about 6 months ago. She has never used smokeless tobacco. She reports that she does not drink alcohol or use illicit drugs.   Prenatal Transfer Tool  Maternal Diabetes: No Genetic Screening: Declined Maternal Ultrasounds/Referrals: Normal Fetal  Ultrasounds or other Referrals:  None Maternal Substance Abuse:  No  Former smoker and alcohol/drugs Significant Maternal Medications:  Meds include: Zoloft Significant Maternal Lab Results:  None Other Comments:  None  ROS    There were no vitals taken for this visit. Maternal Exam:  Uterine Assessment: Contraction strength is mild.  Contraction frequency is irregular.   Abdomen: Patient reports no abdominal tenderness. Fetal presentation: vertex  Introitus: Normal vulva. Normal vagina.    Physical Exam  Constitutional: She appears well-developed and well-nourished.  Cardiovascular: Normal rate and regular rhythm.   Respiratory: Effort normal.  GI: Soft.  Genitourinary: Vagina normal and uterus normal.  Psychiatric: She has a normal mood and affect. Her behavior is normal.    Prenatal labs: ABO, Rh: A/Positive/-- (07/20 0000) Antibody:  Negative Rubella: Immune (07/20 0000) RPR: Nonreactive (07/20 0000)  HBsAg: Negative (07/20 0000)  HIV: Non-reactive (07/20 0000)  GBS: Negative (10/22 0000)  One hour 99 CF negative  Assessment/Plan: Pt for IOL at term with h/o drug abuse, clean now.  Other kids adopted out due to her drug history.  Will get UDS on baby and SW consult.  Also, difficult time with deaths of  Aunt and uncle recently, leaving two small children that need a home.  Plan pitocin and AROM.  Oliver PilaICHARDSON,Sinda Leedom W 09/06/2014, 6:28 PM

## 2014-09-07 ENCOUNTER — Encounter (HOSPITAL_COMMUNITY): Payer: Self-pay | Admitting: Certified Registered Nurse Anesthetist

## 2014-09-07 ENCOUNTER — Encounter (HOSPITAL_COMMUNITY): Payer: Self-pay

## 2014-09-07 ENCOUNTER — Inpatient Hospital Stay (HOSPITAL_COMMUNITY): Payer: Medicaid Other | Admitting: Anesthesiology

## 2014-09-07 ENCOUNTER — Inpatient Hospital Stay (HOSPITAL_COMMUNITY)
Admission: RE | Admit: 2014-09-07 | Discharge: 2014-09-09 | DRG: 775 | Disposition: A | Discharge status: Home / Self Care | Payer: Medicaid Other | Source: Ambulatory Visit | Attending: Obstetrics and Gynecology | Admitting: Obstetrics and Gynecology

## 2014-09-07 DIAGNOSIS — Z87898 Personal history of other specified conditions: Secondary | ICD-10-CM

## 2014-09-07 DIAGNOSIS — Z87891 Personal history of nicotine dependence: Secondary | ICD-10-CM

## 2014-09-07 DIAGNOSIS — Z3A39 39 weeks gestation of pregnancy: Secondary | ICD-10-CM | POA: Diagnosis present

## 2014-09-07 DIAGNOSIS — Z87442 Personal history of urinary calculi: Secondary | ICD-10-CM | POA: Diagnosis not present

## 2014-09-07 DIAGNOSIS — O9989 Other specified diseases and conditions complicating pregnancy, childbirth and the puerperium: Secondary | ICD-10-CM | POA: Diagnosis present

## 2014-09-07 LAB — RAPID URINE DRUG SCREEN, HOSP PERFORMED
AMPHETAMINES: NOT DETECTED
Barbiturates: NOT DETECTED
Benzodiazepines: NOT DETECTED
Cocaine: NOT DETECTED
Opiates: NOT DETECTED
Tetrahydrocannabinol: NOT DETECTED

## 2014-09-07 LAB — RPR

## 2014-09-07 LAB — CBC
HCT: 31.4 % — ABNORMAL LOW (ref 36.0–46.0)
Hemoglobin: 10.3 g/dL — ABNORMAL LOW (ref 12.0–15.0)
MCH: 29.9 pg (ref 26.0–34.0)
MCHC: 32.8 g/dL (ref 30.0–36.0)
MCV: 91 fL (ref 78.0–100.0)
PLATELETS: 190 10*3/uL (ref 150–400)
RBC: 3.45 MIL/uL — ABNORMAL LOW (ref 3.87–5.11)
RDW: 13.4 % (ref 11.5–15.5)
WBC: 8.9 10*3/uL (ref 4.0–10.5)

## 2014-09-07 LAB — TYPE AND SCREEN
ABO/RH(D): A POS
Antibody Screen: NEGATIVE

## 2014-09-07 MED ORDER — FENTANYL 2.5 MCG/ML BUPIVACAINE 1/10 % EPIDURAL INFUSION (WH - ANES)
14.0000 mL/h | INTRAMUSCULAR | Status: DC | PRN
  Administered 2014-09-07: 14 mL/h via EPIDURAL

## 2014-09-07 MED ORDER — OXYTOCIN BOLUS FROM INFUSION: 500.0000 mL | INTRAVENOUS | Status: DC

## 2014-09-07 MED ORDER — WITCH HAZEL-GLYCERIN EX PADS: 1.0000 "application " | MEDICATED_PAD | CUTANEOUS | Status: DC | PRN

## 2014-09-07 MED ORDER — OXYCODONE-ACETAMINOPHEN 5-325 MG PO TABS
2.0000 | ORAL_TABLET | ORAL | Status: DC | PRN
Start: 2014-09-07 — End: 2014-09-09

## 2014-09-07 MED ORDER — TERBUTALINE SULFATE 1 MG/ML IJ SOLN: 0.2500 mg | Freq: Once | INTRAMUSCULAR | Status: DC | PRN

## 2014-09-07 MED ORDER — CITRIC ACID-SODIUM CITRATE 334-500 MG/5ML PO SOLN: 30.0000 mL | ORAL | Status: DC | PRN

## 2014-09-07 MED ORDER — IBUPROFEN 600 MG PO TABS
600.0000 mg | ORAL_TABLET | Freq: Four times a day (QID) | ORAL | Status: DC
  Administered 2014-09-07 – 2014-09-09 (×8): 600 mg via ORAL
  Filled 2014-09-07 (×8): qty 1

## 2014-09-07 MED ORDER — ONDANSETRON HCL 4 MG/2ML IJ SOLN: 4.0000 mg | Freq: Four times a day (QID) | INTRAMUSCULAR | Status: DC | PRN

## 2014-09-07 MED ORDER — OXYCODONE-ACETAMINOPHEN 5-325 MG PO TABS: 1.0000 | ORAL_TABLET | ORAL | Status: DC | PRN

## 2014-09-07 MED ORDER — OXYTOCIN 40 UNITS IN LACTATED RINGERS INFUSION - SIMPLE MED
62.5000 mL/h | INTRAVENOUS | Status: DC
  Administered 2014-09-07: 62.5 mL/h via INTRAVENOUS

## 2014-09-07 MED ORDER — LANOLIN HYDROUS EX OINT: TOPICAL_OINTMENT | CUTANEOUS | Status: DC | PRN

## 2014-09-07 MED ORDER — DIPHENHYDRAMINE HCL 25 MG PO CAPS: 25.0000 mg | ORAL_CAPSULE | Freq: Four times a day (QID) | ORAL | Status: DC | PRN

## 2014-09-07 MED ORDER — DIPHENHYDRAMINE HCL 50 MG/ML IJ SOLN: 12.5000 mg | INTRAMUSCULAR | Status: DC | PRN

## 2014-09-07 MED ORDER — SERTRALINE HCL 50 MG PO TABS
50.0000 mg | ORAL_TABLET | Freq: Every day | ORAL | Status: DC
  Administered 2014-09-08 – 2014-09-09 (×2): 50 mg via ORAL
  Filled 2014-09-07 (×2): qty 1

## 2014-09-07 MED ORDER — ONDANSETRON HCL 4 MG/2ML IJ SOLN: 4.0000 mg | INTRAMUSCULAR | Status: DC | PRN

## 2014-09-07 MED ORDER — PHENYLEPHRINE 40 MCG/ML (10ML) SYRINGE FOR IV PUSH (FOR BLOOD PRESSURE SUPPORT)
PREFILLED_SYRINGE | INTRAVENOUS | Status: AC
  Filled 2014-09-07: qty 10

## 2014-09-07 MED ORDER — OXYTOCIN 40 UNITS IN LACTATED RINGERS INFUSION - SIMPLE MED
1.0000 m[IU]/min | INTRAVENOUS | Status: DC
  Administered 2014-09-07: 2 m[IU]/min via INTRAVENOUS
  Filled 2014-09-07: qty 1000

## 2014-09-07 MED ORDER — PNEUMOCOCCAL VAC POLYVALENT 25 MCG/0.5ML IJ INJ
0.5000 mL | INJECTION | INTRAMUSCULAR | Status: DC
  Filled 2014-09-07: qty 0.5

## 2014-09-07 MED ORDER — FENTANYL 2.5 MCG/ML BUPIVACAINE 1/10 % EPIDURAL INFUSION (WH - ANES)
INTRAMUSCULAR | Status: DC | PRN
  Administered 2014-09-07 (×2): 14 mL/h via EPIDURAL

## 2014-09-07 MED ORDER — EPHEDRINE 5 MG/ML INJ
10.0000 mg | INTRAVENOUS | Status: DC | PRN
  Filled 2014-09-07: qty 2

## 2014-09-07 MED ORDER — LACTATED RINGERS IV SOLN: 500.0000 mL | INTRAVENOUS | Status: DC | PRN

## 2014-09-07 MED ORDER — LACTATED RINGERS IV SOLN: INTRAVENOUS | Status: DC

## 2014-09-07 MED ORDER — OXYCODONE-ACETAMINOPHEN 5-325 MG PO TABS
1.0000 | ORAL_TABLET | ORAL | Status: DC | PRN
  Administered 2014-09-08 – 2014-09-09 (×4): 1 via ORAL
  Filled 2014-09-07 (×4): qty 1

## 2014-09-07 MED ORDER — SIMETHICONE 80 MG PO CHEW: 80.0000 mg | CHEWABLE_TABLET | ORAL | Status: DC | PRN

## 2014-09-07 MED ORDER — FENTANYL 2.5 MCG/ML BUPIVACAINE 1/10 % EPIDURAL INFUSION (WH - ANES)
INTRAMUSCULAR | Status: DC
Start: 2014-09-07 — End: 2014-09-07
  Filled 2014-09-07: qty 125

## 2014-09-07 MED ORDER — PHENYLEPHRINE 40 MCG/ML (10ML) SYRINGE FOR IV PUSH (FOR BLOOD PRESSURE SUPPORT)
80.0000 ug | PREFILLED_SYRINGE | INTRAVENOUS | Status: DC | PRN
  Filled 2014-09-07: qty 2

## 2014-09-07 MED ORDER — LIDOCAINE HCL (PF) 1 % IJ SOLN
INTRAMUSCULAR | Status: DC | PRN
  Administered 2014-09-07 (×2): 8 mL

## 2014-09-07 MED ORDER — LACTATED RINGERS IV SOLN
500.0000 mL | Freq: Once | INTRAVENOUS | Status: AC
  Administered 2014-09-07: 1000 mL via INTRAVENOUS

## 2014-09-07 MED ORDER — ONDANSETRON HCL 4 MG PO TABS: 4.0000 mg | ORAL_TABLET | ORAL | Status: DC | PRN

## 2014-09-07 MED ORDER — ZOLPIDEM TARTRATE 5 MG PO TABS: 5.0000 mg | ORAL_TABLET | Freq: Every evening | ORAL | Status: DC | PRN

## 2014-09-07 MED ORDER — OXYCODONE-ACETAMINOPHEN 5-325 MG PO TABS: 2.0000 | ORAL_TABLET | ORAL | Status: DC | PRN

## 2014-09-07 MED ORDER — ACETAMINOPHEN 325 MG PO TABS: 650.0000 mg | ORAL_TABLET | ORAL | Status: DC | PRN

## 2014-09-07 MED ORDER — BENZOCAINE-MENTHOL 20-0.5 % EX AERO
1.0000 "application " | INHALATION_SPRAY | CUTANEOUS | Status: DC | PRN
  Administered 2014-09-07: 1 via TOPICAL
  Filled 2014-09-07: qty 56

## 2014-09-07 MED ORDER — PRENATAL MULTIVITAMIN CH
1.0000 | ORAL_TABLET | Freq: Every day | ORAL | Status: DC
  Administered 2014-09-08 – 2014-09-09 (×2): 1 via ORAL
  Filled 2014-09-07 (×2): qty 1

## 2014-09-07 MED ORDER — LIDOCAINE HCL (PF) 1 % IJ SOLN
30.0000 mL | INTRAMUSCULAR | Status: DC | PRN
  Filled 2014-09-07: qty 30

## 2014-09-07 MED ORDER — SENNOSIDES-DOCUSATE SODIUM 8.6-50 MG PO TABS
2.0000 | ORAL_TABLET | ORAL | Status: DC
  Administered 2014-09-08 – 2014-09-09 (×2): 2 via ORAL
  Filled 2014-09-07 (×2): qty 2

## 2014-09-07 MED ORDER — DIBUCAINE 1 % RE OINT: 1.0000 "application " | TOPICAL_OINTMENT | RECTAL | Status: DC | PRN

## 2014-09-07 MED ORDER — TETANUS-DIPHTH-ACELL PERTUSSIS 5-2.5-18.5 LF-MCG/0.5 IM SUSP
0.5000 mL | Freq: Once | INTRAMUSCULAR | Status: AC
  Administered 2014-09-08: 0.5 mL via INTRAMUSCULAR

## 2014-09-07 NOTE — Plan of Care (Signed)
Problem: Discharge Progression Outcomes Goal: Barriers To Progression Addressed/Resolved Outcome: Progressing Mother had hx heroin use in past and does not have custody of other children, Social work consult. Current family does not know of use. Mother's UDS was negative saving on infant.

## 2014-09-07 NOTE — Plan of Care (Signed)
Problem: Consults Goal: Postpartum Patient Education (See Patient Education module for education specifics.)  Outcome: Progressing  Problem: Phase I Progression Outcomes Goal: Pain controlled with appropriate interventions Outcome: Completed/Met Date Met:  09/07/14 Goal: Voiding adequately Outcome: Completed/Met Date Met:  09/07/14 Goal: OOB as tolerated unless otherwise ordered Outcome: Completed/Met Date Met:  09/07/14 Goal: Initial discharge plan identified Outcome: Completed/Met Date Met:  09/07/14

## 2014-09-07 NOTE — Progress Notes (Signed)
Patient ID: Maryclare LabradorKimberly L VAILLANCOURT, female   DOB: 12/06/1985, 28 y.o.   MRN: [id number] Pt  had AROM at 930 AM. Since then  FHR has demonstrated a wandering baseline and varied from 120-150.  Good variability and occasional variable decels.   She had an episode of vasovagal reaction to her  IV placement, and FHR had a deceleration with that, then recovered to category 1 tracing.   She just got her epidural and is comfortable IUPC and FSE placed to follow FHR closely.  FM noted with exam and great scalp stimulation. Cervix 50/4+/-2 Continue to increase pitocin and follow closely.

## 2014-09-07 NOTE — Anesthesia Procedure Notes (Signed)
Epidural Patient location during procedure: OB Start time: 09/07/2014 11:34 AM End time: 09/07/2014 1:38 PM  Staffing Anesthesiologist: Leilani AbleHATCHETT, Hobie Kohles  Preanesthetic Checklist Completed: patient identified, surgical consent, pre-op evaluation, timeout performed, IV checked, risks and benefits discussed and monitors and equipment checked  Epidural Patient position: sitting Prep: site prepped and draped and DuraPrep Patient monitoring: continuous pulse ox and blood pressure Approach: midline Location: L3-L4 Injection technique: LOR air  Needle:  Needle type: Tuohy  Needle gauge: 17 G Needle length: 9 cm and 9 Needle insertion depth: 5 cm cm Catheter type: closed end flexible Catheter size: 19 Gauge Catheter at skin depth: 10 cm Test dose: negative and Other  Assessment Sensory level: T9 Events: blood not aspirated, injection not painful, no injection resistance, negative IV test and no paresthesia  Additional Notes Reason for block:procedure for pain

## 2014-09-07 NOTE — Anesthesia Preprocedure Evaluation (Addendum)
Anesthesia Evaluation  Patient identified by MRN, date of birth, ID band Patient awake    Reviewed: Allergy & Precautions, H&P , NPO status , Patient's Chart, lab work & pertinent test results  Airway Mallampati: II  TM Distance: >3 FB Neck ROM: full    Dental no notable dental hx.    Pulmonary former smoker,  breath sounds clear to auscultation  Pulmonary exam normal       Cardiovascular negative cardio ROS  Rhythm:regular Rate:Normal     Neuro/Psych PSYCHIATRIC DISORDERS Depression negative neurological ROS     GI/Hepatic negative GI ROS, Neg liver ROS,   Endo/Other  negative endocrine ROS  Renal/GU      Musculoskeletal   Abdominal   Peds  Hematology negative hematology ROS (+)   Anesthesia Other Findings   Reproductive/Obstetrics (+) Pregnancy                            Anesthesia Physical Anesthesia Plan  ASA: II  Anesthesia Plan: Epidural   Post-op Pain Management:    Induction:   Airway Management Planned:   Additional Equipment:   Intra-op Plan:   Post-operative Plan:   Informed Consent: I have reviewed the patients History and Physical, chart, labs and discussed the procedure including the risks, benefits and alternatives for the proposed anesthesia with the patient or authorized representative who has indicated his/her understanding and acceptance.     Plan Discussed with:   Anesthesia Plan Comments:         Anesthesia Quick Evaluation

## 2014-09-08 NOTE — Lactation Note (Signed)
This note was copied from the chart of Sonya Flores. Lactation Consultation Note Mom has 5 children and this is her first time BF. States BF going well with no difficulty. Denies tenderness to breast. In chart states mom doesn't have custody of her other 5 children d/t drug abuse hx. Currently on Zoloft. SW involved. Mom encouraged to feed baby 8-12 times/24 hours and with feeding cues.  Educated about newborn behavior. Mom encouraged to do skin-to-skin.Mom encouraged to waken baby for feeds. WH/LC brochure given w/resources, support groups and LC services. Referred to Baby and Me Book in Breastfeeding section Pg. 22-23 for position options and Proper latch demonstration.  Patient Name: Sonya Flores ZOXWR'UToday's Date: 09/08/2014 Reason for consult: Initial assessment   Maternal Data Has patient been taught Hand Expression?: Yes Does the patient have breastfeeding experience prior to this delivery?: No  Feeding    LATCH Score/Interventions                      Lactation Tools Discussed/Used     Consult Status Consult Status: Follow-up Date: 09/08/14 Follow-up type: In-patient    Charyl DancerCARVER, Abenezer Odonell G 09/08/2014, 4:58 AM

## 2014-09-08 NOTE — Plan of Care (Signed)
Problem: Consults Goal: Postpartum Patient Education (See Patient Education module for education specifics.)  Outcome: Completed/Met Date Met:  09/08/14  Problem: Phase I Progression Outcomes Goal: VS, stable, temp < 100.4 degrees F Outcome: Completed/Met Date Met:  09/08/14 Goal: Other Phase I Outcomes/Goals Outcome: Completed/Met Date Met:  09/08/14  Problem: Phase II Progression Outcomes Goal: Pain controlled on oral analgesia Outcome: Completed/Met Date Met:  09/08/14 Goal: Progress activity as tolerated unless otherwise ordered Outcome: Completed/Met Date Met:  09/08/14 Goal: Afebrile, VS remain stable Outcome: Completed/Met Date Met:  09/08/14 Goal: Tolerating diet Outcome: Completed/Met Date Met:  09/08/14 Goal: Other Phase II Outcomes/Goals Outcome: Completed/Met Date Met:  09/08/14  Problem: Discharge Progression Outcomes Goal: Barriers To Progression Addressed/Resolved Outcome: Completed/Met Date Met:  09/08/14 Goal: Activity appropriate for discharge plan Outcome: Completed/Met Date Met:  09/08/14 Goal: Tolerating diet Outcome: Completed/Met Date Met:  19/62/22 Goal: Complications resolved/controlled Outcome: Completed/Met Date Met:  09/08/14 Goal: Pain controlled with appropriate interventions Outcome: Completed/Met Date Met:  09/08/14 Goal: Afebrile, VS remain stable at discharge Outcome: Completed/Met Date Met:  09/08/14

## 2014-09-08 NOTE — Progress Notes (Signed)
Clinical Social Work Department PSYCHOSOCIAL ASSESSMENT - MATERNAL/CHILD 2014/08/09  Patient:  Sonya Flores  Account Number:  [id number]  Admit Date:  03-06-14  Sonya Flores   Clinical Social Worker:  Lucita Ferrara, CLINICAL SOCIAL WORKER   Date/Time:  2014-06-04 01:45 PM  Date Referred:  11-19-2013   Referral source  Central Nursery  Physician     Referred reason  Depression/Anxiety  Substance Abuse  Other - See comment   Other referral source:    I:  FAMILY / HOME ENVIRONMENT Child's legal guardian:  PARENT  Guardian - Name Guardian - Age Guardian - Address  Sonya Flores [place], [place] [address]  Sonya Flores  same as above   Other household support members/support persons Other support:   MOB identified her mother and the FOB's as her support system.  She shared belief that they are very supportive and involved.   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 She expressed goal of returning to work in a few months.  The FOB is currently employed.   Financial resources:  Medicaid If Medicaid - County:  [place] / Grade:  N/A Music therapist / Child Services Coordination / Early Interventions:   None reported  Cultural issues impacting care:   None reported    III  STRENGTHS Strengths  Adequate Resources  Home prepared for Child (including basic supplies)  Supportive family/friends   Strength comment:  MOB has identified [facility] as the baby's pediatrician.  MOB openly discussed her mental health, substance use, and CPS involved with CSW.   IV  RISK FACTORS AND CURRENT PROBLEMS Current Problem:  YES   Risk  Factor & Current Problem Patient Issue Family Issue Risk Factor / Current Problem Comment  Substance Abuse Y N MOB presents with history of heroin, cocaine, and THC use.  MOB denied any substance use since December 2012.  MOB and Baby's UDS are negative, MDS is pending.  Mental Illness Y N MOB presents with mental health history significant for depression and anxiety.  MOB is currently prescribed Zoloft and denied current symptoms.  DSS Involvement Y N MOB has history of CPS involvement.  Her 4 other children have been adopted by family members.    V  SOCIAL WORK ASSESSMENT CSW met with the MOB in her room in order to complete the assessment. Consult was ordered due to MOB presenting with a history of depression, substance use, and loss of custody of her 4 other children.  MOB provided consent for the FOB to be present for the entire visit.  MOB and FOB presented as easily engaged, and were receptive to the visit.  MOB and FOB presented as attentive and bonding with the baby.  MOB displayed a full range in affect and presented in a pleasant mood.  MOB openly discussed her history, and was receptive for CSW need to make CPS report.  She acknowledged how her past continues to contribute to her present, but she did not express any concern about potential CPS involvement.   MOB was observed to be smiling widely as she reflected upon having a newborn again.  She stated that she and the FOB are very excited to have  Dreyton present and healthy. MOB shared that she has a lot of support at home from their families, and expressed belief that they have everything they need (and more) for the baby. MOB did not identify any psychosocial stressors that may negatively impact her transition into the postpartum period.    Per MOB, she has a history of depression and anxiety since high school.  She stated that was prescribed Zoloft during the first trimester of her pregnancy due to work related stress.  She shared that  she always felt "angry" and discussed the irritability she felt due to feeling overwhelmed and overworked at her job.  MOB denied any other symptoms once she stabilized on the Zoloft.  Without prompting, she stated that she intends to continue on the Zoloft in the postpartum period to ensure a smooth transition into the postpartum period.  MOB discussed that she and her MD have been in conversation about need to closely monitor her mood for symptoms of postpartum depression.  MOB did not present with acute mental health symptoms, and she presented with motivation to continue current treatment for symptoms.   MOB openly discussed history of heroin, cocaine, and THC use.  She reported history of participating in inpatient programs, including [facility] and [facility].  She stated that she was incarcerated in December 2013, and that she was given opportunity to participate in an inpatient program while in jail.  She shared that she found it to be helpful, and that she has not used any substances since she went to jail in December 2013.  MOB denied any triggers for her use, and shared that she has realized "how life is so much better without drugs".  MOB denied any concerns about relapse, and demonstrated ability to reflect upon potential consequences if she were to relapse.  MOB verbalized understanding of hospital drug screen policy, and denied any concerns since she denies any drug use during her pregnancy.   MOB shared that she has 4 other children (age 49, 80,7, and 3).  She stated that they have all been adopted by family members.  She stated that she pays child support for her oldest, and is currently in the court process to determine child support for her 75 and 55 year old.  MOB stated that she has minimal contact with her oldest three children, but shared belief that it is for the best in order to provide them with stability.  She shared that her 28 year old was adopted by her mother, and that she continues to see her  and be in regular contact with her.  Per MOB, she had no custody of any of her children by the time she was incarcerated in December 2012 (for drug possession).  She stated that CPS became involved due to her drug use, but stated that she voluntary placed her children up for adoption.  MOB verbalized understanding of CSW obligation to make CPS report due to her history. MOB did not become upset and stated that she is aware how her past continues to impact the present, and presented in a state of acceptance of her past.  She shared that she is receptive to meet with CPS and is willing to answer any questions in order to demonstrate that she has moved forward.    MOB frequently verbalized her readiness to parent and her motivation to continue to maintain sobriety.  She smiled as she reflected upon her accomplishments since her time in prison.  She stated that she has  remained sober, has secured employment, obtained her GED, and now she and the FOB have their own apartment.  MOB reported feeling proud of her accomplishments and expressed belief that she has learned from her experiences.    VI SOCIAL WORK PLAN Social Work Plan  Child Scientist, forensic Report  Patient/Family Education   Type of pt/family education:   Postpartum depression  Hospital drug screen policy   If child protective services report - county:  [place] If child protective services report - date:  2014-10-02  Information/referral to community resources comment:   No referrals needed at this time.   Other social work plan:   CSW to follow-up with CPS to determine if report was accepted or screened out. CSW will continue to follow.

## 2014-09-08 NOTE — Anesthesia Postprocedure Evaluation (Signed)
  Anesthesia Post-op Note  Patient: Maryclare LabradorKimberly L Vaillancourt  Procedure(s) Performed: * No procedures listed *  Patient Location: Mother/Baby  Anesthesia Type:Epidural  Level of Consciousness: awake, alert , oriented and patient cooperative  Airway and Oxygen Therapy: Patient Spontanous Breathing  Post-op Pain: mild  Post-op Assessment: Patient's Cardiovascular Status Stable, Respiratory Function Stable, No headache, No backache, No residual numbness and No residual motor weakness  Post-op Vital Signs: Reviewed and stable  Last Vitals:  Filed Vitals:   09/08/14 0030  BP: 125/70  Pulse: 78  Temp: 36.8 C  Resp: 20    Complications: No apparent anesthesia complications

## 2014-09-08 NOTE — Progress Notes (Signed)
UR chart review completed.  

## 2014-09-08 NOTE — Discharge Summary (Signed)
Obstetric Discharge Summary Reason for Admission: induction of labor Prenatal Procedures: none Intrapartum Procedures: spontaneous vaginal delivery Postpartum Procedures: TDAP vaccine Complications-Operative and Postpartum: none HEMOGLOBIN  Date Value Ref Range Status  09/07/2014 10.3* 12.0 - 15.0 g/dL Final   HCT  Date Value Ref Range Status  09/07/2014 31.4* 36.0 - 46.0 % Final    Physical Exam:  General: alert and cooperative Lochia: appropriate Uterine Fundus: firm   Discharge Diagnoses: Term Pregnancy-delivered  Discharge Information: Date: 09/08/2014 Activity: pelvic rest Diet: routine Medications: Ibuprofen Condition: improved Instructions: refer to practice specific booklet Discharge to: home Follow-up Information    Follow up with Sonya PilaICHARDSON,Sonya Rhue W, MD. Schedule an appointment as soon as possible for a visit in 6 weeks.   Specialty:  Obstetrics and Gynecology   Why:  postpartum   Contact information:   [address] [place] [address] [phone]       Newborn Data: Live born female  Birth Weight: 7 lb 3.2 oz (3266 g) APGAR: 8, 9  Home with mother.  Sonya Flores,Sonya Flores 09/08/2014, 8:46 AM

## 2014-09-08 NOTE — Progress Notes (Signed)
Pt. Refused cbc to be drawn this am

## 2014-09-09 MED ORDER — IBUPROFEN 600 MG PO TABS: 600.0000 mg | ORAL_TABLET | Freq: Four times a day (QID) | ORAL | Status: DC

## 2014-09-09 NOTE — Progress Notes (Signed)
Post Partum Day 2  Subjective: no complaints and tolerating PO  Objective: Blood pressure 102/57, pulse 61, temperature 98 F (36.7 C), temperature source Oral, resp. rate 18, height 4\' 11"  (1.499 m), weight 71.668 kg (158 lb), SpO2 97 %, unknown if currently breastfeeding.  Physical Exam:  General: alert and cooperative Lochia: appropriate Uterine Fundus: firm   Recent Labs  09/07/14 0940  HGB 10.3*  HCT 31.4*    Assessment/Plan: Discharge home   LOS: 2 days   Sonya Flores W 09/09/2014, 7:10 AM

## 2014-09-09 NOTE — Plan of Care (Signed)
Problem: Discharge Progression Outcomes Goal: Discharge plan in place and appropriate Outcome: Completed/Met Date Met:  09/09/14 Goal: Other Discharge Outcomes/Goals Outcome: Completed/Met Date Met:  09/09/14

## 2014-09-09 NOTE — Lactation Note (Signed)
This note was copied from the chart of Sonya Flores. Lactation Consultation Note Mom frustrated crying, baby cluster feeding and fussing at the breast. Explained this was normal at times. Discouraged pacifiers. Mom stated she had just given it to the baby to calm him down. Mom and FOB were fussing at each other when the baby was crying because they both were upset d/t baby crying. Explained about hormones with mom. Baby has been BF good. Gave comfort gels for sore nipples.  Patient Name: Sonya Flores ZOXWR'UToday's Date: 09/09/2014 Reason for consult: Follow-up assessment;Difficult latch   Maternal Data    Feeding    LATCH Score/Interventions          Comfort (Breast/Nipple): Filling, red/small blisters or bruises, mild/mod discomfort  Problem noted: Mild/Moderate discomfort Interventions (Mild/moderate discomfort): Comfort gels        Lactation Tools Discussed/Used     Consult Status Consult Status: Complete Date: 09/09/14 Follow-up type: In-patient    Ronnae Kaser, Diamond NickelLAURA G 09/09/2014, 2:22 AM

## 2014-09-09 NOTE — Progress Notes (Signed)
CSW spoke with CPS and stated that the case has not been accepted.   No barriers to discharge.

## 2017-04-16 ENCOUNTER — Encounter (HOSPITAL_COMMUNITY): Payer: Self-pay

## 2017-04-16 DIAGNOSIS — O4290 Premature rupture of membranes, unspecified as to length of time between rupture and onset of labor, unspecified weeks of gestation: Secondary | ICD-10-CM | POA: Diagnosis present

## 2017-08-25 ENCOUNTER — Encounter (HOSPITAL_COMMUNITY): Payer: Self-pay

## 2017-08-26 ENCOUNTER — Encounter: Payer: Self-pay | Admitting: Certified Nurse Midwife

## 2017-08-26 ENCOUNTER — Ambulatory Visit (INDEPENDENT_AMBULATORY_CARE_PROVIDER_SITE_OTHER): Payer: Medicaid Other | Admitting: Certified Nurse Midwife

## 2017-08-26 VITALS — BP 118/79 | HR 69 | Ht 59.0 in | Wt 138.3 lb

## 2017-08-26 DIAGNOSIS — Z3049 Encounter for surveillance of other contraceptives: Secondary | ICD-10-CM

## 2017-08-26 DIAGNOSIS — Z3046 Encounter for surveillance of implantable subdermal contraceptive: Secondary | ICD-10-CM

## 2017-08-26 MED ORDER — NORETHIN-ETH ESTRAD-FE BIPHAS 1 MG-10 MCG / 10 MCG PO TABS: 1.0000 | ORAL_TABLET | Freq: Every day | ORAL | 4 refills | Status: DC

## 2017-08-26 NOTE — Patient Instructions (Signed)
Ethinyl Estradiol; Norethindrone Acetate; Ferrous fumarate tablets or capsules What is this medicine? ETHINYL ESTRADIOL; NORETHINDRONE ACETATE; FERROUS FUMARATE (ETH in il es tra DYE ole; nor eth IN drone AS e tate; FER us FUE ma rate) is an oral contraceptive. The products combine two types of female hormones, an estrogen and a progestin. They are used to prevent ovulation and pregnancy. Some products are also used to treat acne in females. This medicine may be used for other purposes; ask your health care provider or pharmacist if you have questions. COMMON BRAND NAME(S): Blisovi 24 Fe, Blisovi Fe, Estrostep Fe, Gildess 24 Fe, Gildess Fe 1.5/30, Gildess Fe 1/20, Junel Fe 1.5/30, Junel Fe 1/20, Junel Fe 24, Larin Fe, Lo Loestrin Fe, Loestrin 24 Fe, Loestrin FE 1.5/30, Loestrin FE 1/20, Lomedia 24 Fe, Microgestin 24 Fe, Microgestin Fe 1.5/30, Microgestin Fe 1/20, Tarina Fe 1/20, Taytulla, Tilia Fe, Tri-Legest Fe What should I tell my health care provider before I take this medicine? They need to know if you have any of these conditions: -abnormal vaginal bleeding -blood vessel disease -breast, cervical, endometrial, ovarian, liver, or uterine cancer -diabetes -gallbladder disease -heart disease or recent heart attack -high blood pressure -high cholesterol -history of blood clots -kidney disease -liver disease -migraine headaches -smoke tobacco -stroke -systemic lupus erythematosus (SLE) -an unusual or allergic reaction to estrogens, progestins, other medicines, foods, dyes, or preservatives -pregnant or trying to get pregnant -breast-feeding How should I use this medicine? Take this medicine by mouth. To reduce nausea, this medicine may be taken with food. Follow the directions on the prescription label. Take this medicine at the same time each day and in the order directed on the package. Do not take your medicine more often than directed. A patient package insert for the product will be  given with each prescription and refill. Read this sheet carefully each time. The sheet may change frequently. Contact your pediatrician regarding the use of this medicine in children. Special care may be needed. This medicine has been used in female children who have started having menstrual periods. Overdosage: If you think you have taken too much of this medicine contact a poison control center or emergency room at once. NOTE: This medicine is only for you. Do not share this medicine with others. What if I miss a dose? If you miss a dose, refer to the patient information sheet you received with your medicine for direction. If you miss more than one pill, this medicine may not be as effective and you may need to use another form of birth control. What may interact with this medicine? Do not take this medicine with the following medication: -dasabuvir; ombitasvir; paritaprevir; ritonavir -ombitasvir; paritaprevir; ritonavir This medicine may also interact with the following medications: -acetaminophen -antibiotics or medicines for infections, especially rifampin, rifabutin, rifapentine, and griseofulvin, and possibly penicillins or tetracyclines -aprepitant -ascorbic acid (vitamin C) -atorvastatin -barbiturate medicines, such as phenobarbital -bosentan -carbamazepine -caffeine -clofibrate -cyclosporine -dantrolene -doxercalciferol -felbamate -grapefruit juice -hydrocortisone -medicines for anxiety or sleeping problems, such as diazepam or temazepam -medicines for diabetes, including pioglitazone -mineral oil -modafinil -mycophenolate -nefazodone -oxcarbazepine -phenytoin -prednisolone -ritonavir or other medicines for HIV infection or AIDS -rosuvastatin -selegiline -soy isoflavones supplements -St. John's wort -tamoxifen or raloxifene -theophylline -thyroid hormones -topiramate -warfarin This list may not describe all possible interactions. Give your health care  provider a list of all the medicines, herbs, non-prescription drugs, or dietary supplements you use. Also tell them if you smoke, drink alcohol, or use illegal drugs. Some   items may interact with your medicine. What should I watch for while using this medicine? Visit your doctor or health care professional for regular checks on your progress. You will need a regular breast and pelvic exam and Pap smear while on this medicine. Use an additional method of contraception during the first cycle that you take these tablets. If you have any reason to think you are pregnant, stop taking this medicine right away and contact your doctor or health care professional. If you are taking this medicine for hormone related problems, it may take several cycles of use to see improvement in your condition. Smoking increases the risk of getting a blood clot or having a stroke while you are taking birth control pills, especially if you are more than 31 years old. You are strongly advised not to smoke. This medicine can make your body retain fluid, making your fingers, hands, or ankles swell. Your blood pressure can go up. Contact your doctor or health care professional if you feel you are retaining fluid. This medicine can make you more sensitive to the sun. Keep out of the sun. If you cannot avoid being in the sun, wear protective clothing and use sunscreen. Do not use sun lamps or tanning beds/booths. If you wear contact lenses and notice visual changes, or if the lenses begin to feel uncomfortable, consult your eye care specialist. In some women, tenderness, swelling, or minor bleeding of the gums may occur. Notify your dentist if this happens. Brushing and flossing your teeth regularly may help limit this. See your dentist regularly and inform your dentist of the medicines you are taking. If you are going to have elective surgery, you may need to stop taking this medicine before the surgery. Consult your health care  professional for advice. This medicine does not protect you against HIV infection (AIDS) or any other sexually transmitted diseases. What side effects may I notice from receiving this medicine? Side effects that you should report to your doctor or health care professional as soon as possible: -allergic reactions like skin rash, itching or hives, swelling of the face, lips, or tongue -breast tissue changes or discharge -changes in vaginal bleeding during your period or between your periods -changes in vision -chest pain -confusion -coughing up blood -dizziness -feeling faint or lightheaded -headaches or migraines -leg, arm or groin pain -loss of balance or coordination -severe or sudden headaches -stomach pain (severe) -sudden shortness of breath -sudden numbness or weakness of the face, arm or leg -symptoms of vaginal infection like itching, irritation or unusual discharge -tenderness in the upper abdomen -trouble speaking or understanding -vomiting -yellowing of the eyes or skin Side effects that usually do not require medical attention (report to your doctor or health care professional if they continue or are bothersome): -breakthrough bleeding and spotting that continues beyond the 3 initial cycles of pills -breast tenderness -mood changes, anxiety, depression, frustration, anger, or emotional outbursts -increased sensitivity to sun or ultraviolet light -nausea -skin rash, acne, or brown spots on the skin -weight gain (slight) This list may not describe all possible side effects. Call your doctor for medical advice about side effects. You may report side effects to FDA at 1-800-FDA-1088. Where should I keep my medicine? Keep out of the reach of children. Store at room temperature between 15 and 30 degrees C (59 and 86 degrees F). Throw away any unused medicine after the expiration date. NOTE: This sheet is a summary. It may not cover all possible information. If you   have  questions about this medicine, talk to your doctor, pharmacist, or health care provider.  2018 Elsevier/Gold Standard (2016-06-17 08:04:41)  

## 2017-08-26 NOTE — Progress Notes (Signed)
Sonya Flores is a 31 y.o. year old Caucasian female here for Implanon removal.  Patient given informed consent for removal of her Nexplanon.  BP 118/79   Pulse 69   Ht 4\' 11"  (1.499 m)   Wt 138 lb 4.8 oz (62.7 kg)   BMI 27.93 kg/m   Appropriate time out taken. Nexplanon site identified.  Area prepped in usual sterile fashon. One cc of 2% lidocaine was used to anesthetize the area at the distal end of the implant. A small stab incision was made right beside the implant on the distal portion.  The Nexplanon rod was grasped using hemostats and removed without difficulty.  There was less than 3 cc blood loss. There were no complications.  Steri-strips were applied over the small incision and a pressure bandage was applied.  The patient tolerated the procedure well.  She was instructed to keep the area clean and dry, remove pressure bandage in 24 hours, and keep insertion site covered with the steri-strip for 3-5 days.    Rx: Lo Loesterin Fe, see orders. Sample and discount card given.   Reviewed red flag symptoms and when to call.   RTC x 3 months for follow up or sooner if needed.    Sonya Flores, CNM,CNM

## 2017-11-27 ENCOUNTER — Encounter: Payer: Medicaid Other | Admitting: Certified Nurse Midwife

## 2017-12-10 ENCOUNTER — Telehealth: Payer: Self-pay | Admitting: Certified Nurse Midwife

## 2017-12-10 NOTE — Telephone Encounter (Signed)
Pt aware she has 4 refills at pharmacy of lo loestrin. She states she and her son are getting over the flu. She will contact for 3 month f/u when they are both well.

## 2017-12-10 NOTE — Telephone Encounter (Signed)
The [facility] and [facility]. And that she would like to speak with a nurse. No other information was disclosed. Please advise.

## 2018-02-18 ENCOUNTER — Emergency Department (HOSPITAL_COMMUNITY)
Admission: EM | Admit: 2018-02-18 | Discharge: 2018-02-18 | Disposition: A | Discharge status: Home / Self Care | Payer: Self-pay | Attending: Emergency Medicine | Admitting: Emergency Medicine

## 2018-02-18 ENCOUNTER — Emergency Department (HOSPITAL_COMMUNITY): Payer: Self-pay

## 2018-02-18 ENCOUNTER — Encounter (HOSPITAL_COMMUNITY): Payer: Self-pay

## 2018-02-18 ENCOUNTER — Other Ambulatory Visit: Payer: Self-pay

## 2018-02-18 DIAGNOSIS — Z87891 Personal history of nicotine dependence: Secondary | ICD-10-CM | POA: Insufficient documentation

## 2018-02-18 DIAGNOSIS — J0100 Acute maxillary sinusitis, unspecified: Secondary | ICD-10-CM

## 2018-02-18 DIAGNOSIS — Z79899 Other long term (current) drug therapy: Secondary | ICD-10-CM | POA: Insufficient documentation

## 2018-02-18 MED ORDER — FLUTICASONE PROPIONATE 50 MCG/ACT NA SUSP: 1.0000 | Freq: Every day | NASAL | 2 refills | Status: DC

## 2018-02-18 MED ORDER — AMOXICILLIN-POT CLAVULANATE 875-125 MG PO TABS: 1.0000 | ORAL_TABLET | Freq: Two times a day (BID) | ORAL | 0 refills | Status: DC

## 2018-02-18 NOTE — Discharge Instructions (Signed)
Please read attached information. If you experience any new or worsening signs or symptoms please return to the emergency room for evaluation. Please follow-up with your primary care provider or specialist as discussed. Please use medication prescribed only as directed and discontinue taking if you have any concerning signs or symptoms.   °

## 2018-02-18 NOTE — ED Provider Notes (Signed)
[facility] EMERGENCY DEPARTMENT Provider Note   CSN: [id number] Arrival date & time: 02/18/18  1500     History   Chief Complaint Chief Complaint  Patient presents with  . URI    HPI Sonya Flores is a 32 y.o. female.  HPI   32 year old female presents today with complaints of upper respiratory infection.  Patient notes 2 weeks of nasal congestion, cough, runny nose body aches and headache.  She notes sinus pressure and pain in the bilateral maxillary region.  Patient notes decreased taste and smell.  Patient notes that she has been using Delsym, Mucinex and Motrin without significant improvement in her symptoms.  Patient also reports she has had a fever at home. Pt denies SOB, she notes some muscular back pain worse with movement and palpation.    Past Medical History:  Diagnosis Date  . Depression   . History of shingles   . Kidney stone     There are no active problems to display for this patient.   Past Surgical History:  Procedure Laterality Date  . NO PAST SURGERIES       OB History    Gravida  2   Para  1   Term  1   Preterm  0   AB  1   Living  1     SAB  1   TAB  0   Ectopic  0   Multiple  0   Live Births  1            Home Medications    Prior to Admission medications   Medication Sig Start Date End Date Taking? Authorizing Provider  Norethindrone-Ethinyl Estradiol-Fe Biphas (LO LOESTRIN FE) 1 MG-10 MCG / 10 MCG tablet Take 1 tablet daily by mouth. 08/26/17  Yes Lawhorn, Vanessa Limestone, CNM  amoxicillin-clavulanate (AUGMENTIN) 875-125 MG tablet Take 1 tablet by mouth every 12 (twelve) hours. 02/18/18   Shelly Shoultz, Tinnie Gens, PA-C  fluticasone (FLONASE) 50 MCG/ACT nasal spray Place 1 spray into both nostrils daily. 02/18/18   Eyvonne Mechanic, PA-C    Family History Family History  Problem Relation Age of Onset  . Osteoarthritis Mother   . Cancer Mother        melanoma  . Hyperlipidemia Mother   .  Hypertension Mother   . Osteoarthritis Maternal Grandfather   . Cancer Maternal Grandfather        lung and liver cancer  . Heart failure Maternal Grandfather   . Hypertension Maternal Grandfather     Social History Social History   Tobacco Use  . Smoking status: Former Smoker    Packs/day: 0.25    Last attempt to quit: 02/12/2014    Years since quitting: 4.0  . Smokeless tobacco: Never Used  Substance Use Topics  . Alcohol use: Yes    Comment: occ  . Drug use: No     Allergies   Patient has no known allergies.   Review of Systems Review of Systems  All other systems reviewed and are negative.   Physical Exam Updated Vital Signs BP (!) 136/100 (BP Location: Right Arm)   Pulse 85   Temp 98.8 F (37.1 C) (Oral)   Resp 18   Ht  (1.499 m)   Wt 63.5 kg (140 lb)   LMP 10/21/2017 (Approximate)   SpO2 99%   Breastfeeding? No   BMI 28.28 kg/m   Physical Exam  Constitutional: She is oriented to person, place, and time. She  appears well-developed and well-nourished.  HENT:  Head: Normocephalic and atraumatic.  TTP of bialteral maxillary sinus, no purulent nasal discharge  Eyes: Pupils are equal, round, and reactive to light. Conjunctivae are normal. Right eye exhibits no discharge. Left eye exhibits no discharge. No scleral icterus.  Neck: Normal range of motion. No JVD present. No tracheal deviation present.  Pulmonary/Chest: Effort normal and breath sounds normal. No stridor. No respiratory distress. She has no wheezes. She has no rales. She exhibits no tenderness.  Neurological: She is alert and oriented to person, place, and time. Coordination normal.  Psychiatric: She has a normal mood and affect. Her behavior is normal. Judgment and thought content normal.  Nursing note and vitals reviewed.   ED Treatments / Results  Labs (all labs ordered are listed, but only abnormal results are displayed) Labs Reviewed - No data to  display  EKG None  Radiology Dg Chest 2 View  Result Date: 02/18/2018 CLINICAL DATA:  Cough and fever EXAM: CHEST - 2 VIEW COMPARISON:  07/26/2009 FINDINGS: The heart size and mediastinal contours are within normal limits. Both lungs are clear. The visualized skeletal structures are unremarkable. IMPRESSION: No active cardiopulmonary disease. Electronically Signed   By: Marlan Palau M.D.   On: 02/18/2018 16:22    Procedures Procedures (including critical care time)  Medications Ordered in ED Medications - No data to display   Initial Impression / Assessment and Plan / ED Course  I have reviewed the triage vital signs and the nursing notes.  Pertinent labs & imaging results that were available during my care of the patient were reviewed by me and considered in my medical decision making (see chart for details).     Final Clinical Impressions(s) / ED Diagnoses   Final diagnoses:  Acute non-recurrent maxillary sinusitis   Labs:   Imaging: DG chest 2 view  Consults:  Therapeutics:  Discharge Meds: Augmentin, Flonase   Assessment/Plan: 32 year old female presents today with likely bacterial sinusitis.  Given her description of symptoms, duration of symptoms she will be started on Augmentin.  Patient has no signs of significant pulmonary infection including pneumonia.  Negative chest x-ray clear lung sounds and reassuring vital signs.  Patient is given strict return precautions, follow-up information.  She verbalized understanding and agreement to today's plan had no further questions or concerns at the time discharge.   ED Discharge Orders        Ordered    amoxicillin-clavulanate (AUGMENTIN) 875-125 MG tablet  Every 12 hours     02/18/18 1753    fluticasone (FLONASE) 50 MCG/ACT nasal spray  Daily     02/18/18 1754       Eyvonne Mechanic, PA-C 02/18/18 1805    Nira Conn, MD 02/19/18 1340

## 2018-02-18 NOTE — ED Notes (Signed)
Patient transported to X-ray 

## 2018-02-18 NOTE — ED Triage Notes (Signed)
Pt endorses URI sx with productive cough, congestion x 2 weeks. Afebrile.

## 2018-07-15 LAB — OB RESULTS CONSOLE RPR: RPR: NONREACTIVE

## 2018-07-15 LAB — OB RESULTS CONSOLE GC/CHLAMYDIA
CHLAMYDIA, DNA PROBE: NEGATIVE
GC PROBE AMP, GENITAL: NEGATIVE

## 2018-07-15 LAB — OB RESULTS CONSOLE ABO/RH: RH Type: POSITIVE

## 2018-07-15 LAB — OB RESULTS CONSOLE ANTIBODY SCREEN: ANTIBODY SCREEN: NEGATIVE

## 2018-07-15 LAB — OB RESULTS CONSOLE HEPATITIS B SURFACE ANTIGEN: Hepatitis B Surface Ag: NEGATIVE

## 2018-07-15 LAB — OB RESULTS CONSOLE HIV ANTIBODY (ROUTINE TESTING): HIV: NONREACTIVE

## 2018-07-15 LAB — OB RESULTS CONSOLE RUBELLA ANTIBODY, IGM: Rubella: IMMUNE

## 2018-07-26 ENCOUNTER — Encounter (HOSPITAL_COMMUNITY): Payer: Self-pay | Admitting: Anesthesiology

## 2018-07-26 ENCOUNTER — Encounter (HOSPITAL_COMMUNITY): Payer: Self-pay | Admitting: *Deleted

## 2018-07-26 ENCOUNTER — Inpatient Hospital Stay (HOSPITAL_COMMUNITY)
Admission: AD | Admit: 2018-07-26 | Discharge: 2018-07-26 | Disposition: A | Discharge status: Home / Self Care | Payer: Medicaid Other | Source: Ambulatory Visit | Attending: Obstetrics and Gynecology | Admitting: Obstetrics and Gynecology

## 2018-07-26 DIAGNOSIS — Z91018 Allergy to other foods: Secondary | ICD-10-CM | POA: Diagnosis not present

## 2018-07-26 DIAGNOSIS — F410 Panic disorder [episodic paroxysmal anxiety] without agoraphobia: Secondary | ICD-10-CM | POA: Diagnosis not present

## 2018-07-26 DIAGNOSIS — A084 Viral intestinal infection, unspecified: Secondary | ICD-10-CM | POA: Diagnosis not present

## 2018-07-26 DIAGNOSIS — Z792 Long term (current) use of antibiotics: Secondary | ICD-10-CM | POA: Diagnosis not present

## 2018-07-26 DIAGNOSIS — Z79899 Other long term (current) drug therapy: Secondary | ICD-10-CM | POA: Diagnosis not present

## 2018-07-26 DIAGNOSIS — Z3A14 14 weeks gestation of pregnancy: Secondary | ICD-10-CM | POA: Insufficient documentation

## 2018-07-26 DIAGNOSIS — Z87891 Personal history of nicotine dependence: Secondary | ICD-10-CM | POA: Diagnosis not present

## 2018-07-26 DIAGNOSIS — O9989 Other specified diseases and conditions complicating pregnancy, childbirth and the puerperium: Secondary | ICD-10-CM | POA: Diagnosis not present

## 2018-07-26 LAB — URINALYSIS, ROUTINE W REFLEX MICROSCOPIC
Bilirubin Urine: NEGATIVE
Glucose, UA: NEGATIVE mg/dL
Hgb urine dipstick: NEGATIVE
KETONES UR: 20 mg/dL — AB
Nitrite: NEGATIVE
PROTEIN: NEGATIVE mg/dL
Specific Gravity, Urine: 1.009 (ref 1.005–1.030)
pH: 7 (ref 5.0–8.0)

## 2018-07-26 LAB — RAPID URINE DRUG SCREEN, HOSP PERFORMED
Amphetamines: NOT DETECTED
BENZODIAZEPINES: POSITIVE — AB
Barbiturates: NOT DETECTED
COCAINE: NOT DETECTED
OPIATES: NOT DETECTED
Tetrahydrocannabinol: NOT DETECTED

## 2018-07-26 MED ORDER — DIPHENHYDRAMINE HCL 50 MG/ML IJ SOLN
25.0000 mg | Freq: Once | INTRAMUSCULAR | Status: AC
  Administered 2018-07-26: 25 mg via INTRAVENOUS
  Filled 2018-07-26: qty 1

## 2018-07-26 MED ORDER — LOPERAMIDE HCL 2 MG PO CAPS
4.0000 mg | ORAL_CAPSULE | Freq: Once | ORAL | Status: AC
  Administered 2018-07-26: 4 mg via ORAL
  Filled 2018-07-26: qty 2

## 2018-07-26 MED ORDER — ONDANSETRON HCL 4 MG/2ML IJ SOLN
4.0000 mg | Freq: Once | INTRAMUSCULAR | Status: AC
  Administered 2018-07-26: 4 mg via INTRAVENOUS
  Filled 2018-07-26: qty 2

## 2018-07-26 MED ORDER — LACTATED RINGERS IV BOLUS
1000.0000 mL | Freq: Once | INTRAVENOUS | Status: AC
  Administered 2018-07-26: 1000 mL via INTRAVENOUS

## 2018-07-26 MED ORDER — PROMETHAZINE HCL 25 MG PO TABS: 12.5000 mg | ORAL_TABLET | Freq: Four times a day (QID) | ORAL | 0 refills | Status: DC | PRN

## 2018-07-26 NOTE — MAU Provider Note (Signed)
History     CSN: [id number]  Arrival date and time: 07/26/18 1216   First Provider Initiated Contact with Patient 07/26/18 1425      Chief Complaint  Patient presents with  . Other    gas pain  . Diarrhea  . Emesis  . Nausea   Diarrhea   This is a new problem. The current episode started yesterday. The problem occurs more than 10 times per day. The problem has been unchanged. The stool consistency is described as blood tinged. The patient states that diarrhea awakens her from sleep. Associated symptoms include vomiting. Nothing aggravates the symptoms. Risk factors: works in [employer], and [employer] She has tried nothing for the symptoms.  Emesis   This is a new problem. The current episode started yesterday. The problem occurs less than 2 times per day. The problem has been unchanged. The emesis has an appearance of stomach contents. There has been no fever. Associated symptoms include diarrhea. Treatments tried: took a xanax last night because she states "I felt so bad and was having a panic attack because I felt so bad"    OB History    Gravida  3   Para  1   Term  1   Preterm  0   AB  1   Living  1     SAB  1   TAB  0   Ectopic  0   Multiple  0   Live Births  1           Past Medical History:  Diagnosis Date  . Depression   . History of shingles   . Kidney stone     Past Surgical History:  Procedure Laterality Date  . NO PAST SURGERIES      Family History  Problem Relation Age of Onset  . Osteoarthritis Mother   . Cancer Mother        melanoma  . Hyperlipidemia Mother   . Hypertension Mother   . Osteoarthritis Maternal Grandfather   . Cancer Maternal Grandfather        lung and liver cancer  . Heart failure Maternal Grandfather   . Hypertension Maternal Grandfather     Social History   Tobacco Use  . Smoking status: Former Smoker    Packs/day: 0.25    Last attempt to quit: 02/12/2014    Years since quitting: 4.4  .  Smokeless tobacco: Never Used  Substance Use Topics  . Alcohol use: Yes    Comment: occ  . Drug use: No    Allergies:  Allergies  Allergen Reactions  . Coconut Flavor Swelling    Throat swells    Medications Prior to Admission  Medication Sig Dispense Refill Last Dose  . amoxicillin-clavulanate (AUGMENTIN) 875-125 MG tablet Take 1 tablet by mouth every 12 (twelve) hours. 20 tablet 0   . fluticasone (FLONASE) 50 MCG/ACT nasal spray Place 1 spray into both nostrils daily. 9.9 g 2   . Norethindrone-Ethinyl Estradiol-Fe Biphas (LO LOESTRIN FE) 1 MG-10 MCG / 10 MCG tablet Take 1 tablet daily by mouth. 3 Package 4 02/18/2018 at Unknown time    Review of Systems  Gastrointestinal: Positive for diarrhea and vomiting.   Physical Exam   Blood pressure 126/81, pulse 75, temperature 97.9 F (36.6 C), temperature source Oral, resp. rate 17, last menstrual period 10/21/2017.  Physical Exam  Nursing note and vitals reviewed. Constitutional: She is oriented to person, place, and time. She appears well-developed and well-nourished. No  distress.  HENT:  Head: Normocephalic.  Cardiovascular: Normal rate.  Respiratory: Effort normal.  GI: Soft.  Genitourinary:  Genitourinary Comments: FHT 155 with doppler   Neurological: She is alert and oriented to person, place, and time.  Skin: Skin is warm.  Psychiatric: She has a normal mood and affect.    Results for orders placed or performed during the hospital encounter of 07/26/18 (from the past 24 hour(s))  Urinalysis, Routine w reflex microscopic     Status: Abnormal   Collection Time: 07/26/18  2:27 PM  Result Value Ref Range   Color, Urine YELLOW YELLOW   APPearance HAZY (A) CLEAR   Specific Gravity, Urine 1.009 1.005 - 1.030   pH 7.0 5.0 - 8.0   Glucose, UA NEGATIVE NEGATIVE mg/dL   Hgb urine dipstick NEGATIVE NEGATIVE   Bilirubin Urine NEGATIVE NEGATIVE   Ketones, ur 20 (A) NEGATIVE mg/dL   Protein, ur NEGATIVE NEGATIVE mg/dL    Nitrite NEGATIVE NEGATIVE   Leukocytes, UA TRACE (A) NEGATIVE   RBC / HPF 0-5 0 - 5 RBC/hpf   WBC, UA 6-10 0 - 5 WBC/hpf   Bacteria, UA RARE (A) NONE SEEN   Squamous Epithelial / LPF 21-50 0 - 5   Mucus PRESENT   Urine rapid drug screen (hosp performed)     Status: Abnormal   Collection Time: 07/26/18  2:27 PM  Result Value Ref Range   Opiates NONE DETECTED NONE DETECTED   Cocaine NONE DETECTED NONE DETECTED   Benzodiazepines POSITIVE (A) NONE DETECTED   Amphetamines NONE DETECTED NONE DETECTED   Tetrahydrocannabinol NONE DETECTED NONE DETECTED   Barbiturates NONE DETECTED NONE DETECTED    MAU Course  Procedures  MDM After zofran infusion patient reported that her IV was burning. RN noticed some small bumps on her arm just above IV site. IV site intact and not infiltrated. Will give benadryl for possible allergic reaction, and will continue to monitor.   On second inspection of arm: no erythema, no rash, no welts, no hives. Reassured patient that likely the redness and burning was from the IV fluids and medications and not an allergic reaction.   Patient has had 2L of LR, Zofran, imodium and benadryl. She has not had any diarrhea or emesis while here. She reports that she is feeling better at this time.   Assessment and Plan   1. Viral gastroenteritis   2. [redacted] weeks gestation of pregnancy    DC home Comfort measures reviewed  2nd Trimester precautions  PTL precautions  Fetal kick counts RX: phenergan PRN #30  Ok to use imodium OTC if needed  Return to MAU as needed FU with OB as planned  Follow-up Information    [facility] Follow up.   Contact information: [address]  [address] [place] [address] [phone]            Thressa Sheller 07/26/2018, 2:27 PM

## 2018-07-26 NOTE — Discharge Instructions (Signed)
Food Choices to Help Relieve Diarrhea, Adult When you have diarrhea, the foods you eat and your eating habits are very important. Choosing the right foods and drinks can help:  Relieve diarrhea.  Replace lost fluids and nutrients.  Prevent dehydration.  What general guidelines should I follow? Relieving diarrhea  Choose foods with less than 2 g or .07 oz. of fiber per serving.  Limit fats to less than 8 tsp (38 g or 1.34 oz.) a day.  Avoid the following: ? Foods and beverages sweetened with high-fructose corn syrup, honey, or sugar alcohols such as xylitol, sorbitol, and mannitol. ? Foods that contain a lot of fat or sugar. ? Fried, greasy, or spicy foods. ? High-fiber grains, breads, and cereals. ? Raw fruits and vegetables.  Eat foods that are rich in probiotics. These foods include dairy products such as yogurt and fermented milk products. They help increase healthy bacteria in the stomach and intestines (gastrointestinal tract, or GI tract).  If you have lactose intolerance, avoid dairy products. These may make your diarrhea worse.  Take medicine to help stop diarrhea (antidiarrheal medicine) only as told by your health care provider. Replacing nutrients  Eat small meals or snacks every 3-4 hours.  Eat bland foods, such as white rice, toast, or baked potato, until your diarrhea starts to get better. Gradually reintroduce nutrient-rich foods as tolerated or as told by your health care provider. This includes: ? Well-cooked protein foods. ? Peeled, seeded, and soft-cooked fruits and vegetables. ? Low-fat dairy products.  Take vitamin and mineral supplements as told by your health care provider. Preventing dehydration   Start by sipping water or a special solution to prevent dehydration (oral rehydration solution, ORS). Urine that is clear or pale yellow means that you are getting enough fluid.  Try to drink at least 8-10 cups of fluid each day to help replace lost  fluids.  You may add other liquids in addition to water, such as clear juice or decaffeinated sports drinks, as tolerated or as told by your health care provider.  Avoid drinks with caffeine, such as coffee, tea, or soft drinks.  Avoid alcohol. What foods are recommended? The items listed may not be a complete list. Talk with your health care provider about what dietary choices are best for you. Grains White rice. White, [place], or pita breads (fresh or toasted), including plain rolls, buns, or bagels. White pasta. Saltine, soda, or graham crackers. Pretzels. Low-fiber cereal. Cooked cereals made with water (such as cornmeal, farina, or cream cereals). Plain muffins. Matzo. Melba toast. Zwieback. Vegetables Potatoes (without the skin). Most well-cooked and canned vegetables without skins or seeds. Tender lettuce. Fruits Apple sauce. Fruits canned in juice. Cooked apricots, cherries, grapefruit, peaches, pears, or plums. Fresh bananas and cantaloupe. Meats and other protein foods Baked or boiled chicken. Eggs. Tofu. Fish. Seafood. Smooth nut butters. Ground or well-cooked tender beef, ham, veal, lamb, pork, or poultry. Dairy Plain yogurt, kefir, and unsweetened liquid yogurt. Lactose-free milk, buttermilk, skim milk, or soy milk. Low-fat or nonfat hard cheese. Beverages Water. Low-calorie sports drinks. Fruit juices without pulp. Strained tomato and vegetable juices. Decaffeinated teas. Sugar-free beverages not sweetened with sugar alcohols. Oral rehydration solutions, if approved by your health care provider. Seasoning and other foods Bouillon, broth, or soups made from recommended foods. What foods are not recommended? The items listed may not be a complete list. Talk with your health care provider about what dietary choices are best for you. Grains Whole grain, whole wheat,  bran, or rye breads, rolls, pastas, and crackers. Wild or brown rice. Whole grain or bran cereals. Barley. Oats and  oatmeal. Corn tortillas or taco shells. Granola. Popcorn. °Vegetables °Raw vegetables. Fried vegetables. Cabbage, broccoli, Brussels sprouts, artichokes, baked beans, beet greens, corn, kale, legumes, peas, sweet potatoes, and yams. Potato skins. Cooked spinach and cabbage. °Fruits °Dried fruit, including raisins and dates. Raw fruits. Stewed or dried prunes. Canned fruits with syrup. °Meat and other protein foods °Fried or fatty meats. Deli meats. Chunky nut butters. Nuts and seeds. Beans and lentils. Bacon. Hot dogs. Sausage. °Dairy °High-fat cheeses. Whole milk, chocolate milk, and beverages made with milk, such as milk shakes. Half-and-half. Cream. sour cream. Ice cream. °Beverages °Caffeinated beverages (such as coffee, tea, soda, or energy drinks). Alcoholic beverages. Fruit juices with pulp. Prune juice. Soft drinks sweetened with high-fructose corn syrup or sugar alcohols. High-calorie sports drinks. °Fats and oils °Butter. Cream sauces. Margarine. Salad oils. Plain salad dressings. Olives. Avocados. Mayonnaise. °Sweets and desserts °Sweet rolls, doughnuts, and sweet breads. Sugar-free desserts sweetened with sugar alcohols such as xylitol and sorbitol. °Seasoning and other foods °Honey. Hot sauce. Chili powder. Gravy. Cream-based or milk-based soups. Pancakes and waffles. °Summary °· When you have diarrhea, the foods you eat and your eating habits are very important. °· Make sure you get at least 8-10 cups of fluid each day, or enough to keep your urine clear or pale yellow. °· Eat bland foods and gradually reintroduce healthy, nutrient-rich foods as tolerated, or as told by your health care provider. °· Avoid high-fiber, fried, greasy, or spicy foods. °This information is not intended to replace advice given to you by your health care provider. Make sure you discuss any questions you have with your health care provider. °Document Released: 12/28/2003 Document Revised: 10/04/2016 Document Reviewed:  10/04/2016 °Elsevier Interactive Patient Education © 2018 [employer] ° ° °Safe Medications in Pregnancy  ° °Acne:  °Benzoyl Peroxide  °Salicylic Acid  ° °Backache/Headache:  °Tylenol: 2 regular strength every 4 hours OR  °             2 Extra strength every 6 hours  ° °Colds/Coughs/Allergies:  °Benadryl (alcohol free) 25 mg every 6 hours as needed  °Breath right strips  °Claritin  °Cepacol throat lozenges  °Chloraseptic throat spray  °Cold-Eeze- up to three times per day  °Cough drops, alcohol free  °Flonase (by prescription only)  °Guaifenesin  °Mucinex  °Robitussin DM (plain only, alcohol free)  °Saline nasal spray/drops  °Sudafed (pseudoephedrine) & Actifed * use only after [redacted] weeks gestation and if you do not have high blood pressure  °Tylenol  °Vicks Vaporub  °Zinc lozenges  °Zyrtec  ° °Constipation:  °Colace  °Ducolax suppositories  °Fleet enema  °Glycerin suppositories  °Metamucil  °Milk of magnesia  °Miralax  °Senokot  °Smooth move tea  ° °Diarrhea:  °Kaopectate  °Imodium A-D  ° °*NO pepto Bismol  ° °Hemorrhoids:  °Anusol  °Anusol HC  °Preparation H  °Tucks  ° °Indigestion:  °Tums  °Maalox  °Mylanta  °Zantac  °Pepcid  ° °Insomnia:  °Benadryl (alcohol free) 25mg every 6 hours as needed  °Tylenol PM  °Unisom, no Gelcaps  ° °Leg Cramps:  °Tums  °MagGel  ° °Nausea/Vomiting:  °Bonine  °Dramamine  °Emetrol  °Ginger extract  °Sea bands  °Meclizine  °Nausea medication to take during pregnancy:  °Unisom (doxylamine succinate 25 mg tablets) Take one tablet daily at bedtime. If symptoms are not adequately controlled, the dose can be increased to a maximum recommended dose of   two tablets daily (1/2 tablet in the morning, 1/2 tablet mid-afternoon and one at bedtime).  °Vitamin B6 100mg tablets. Take one tablet twice a day (up to 200 mg per day).  ° °Skin Rashes:  °Aveeno products  °Benadryl cream or 25mg every 6 hours as needed  °Calamine Lotion  °1% cortisone cream  ° °Yeast infection:  °Gyne-lotrimin 7  °Monistat 7   ° ° °**If taking multiple medications, please check labels to avoid duplicating the same active ingredients  °**take medication as directed on the label  °** Do not exceed 4000 mg of tylenol in 24 hours  °**Do not take medications that contain aspirin or ibuprofen  °    °   ° ° ° °

## 2018-07-26 NOTE — Progress Notes (Signed)
Pt stated her arm started burning after she was given IV zofran.  She also developed welts above the area of the IV and redness.  So the line was clamped, saline lock changed out and flushed, and will administer iv benadryl per Thressa Sheller, CNM.

## 2018-07-26 NOTE — MAU Note (Signed)
Pt said she started with nausea vomiting and diarrhea around 2100.  She said she has had several episodes of each.  Some rectal bleeding.  Denies lof.

## 2018-12-28 LAB — OB RESULTS CONSOLE GBS: STREP GROUP B AG: NEGATIVE

## 2019-01-07 ENCOUNTER — Telehealth (HOSPITAL_COMMUNITY): Payer: Self-pay | Admitting: *Deleted

## 2019-01-07 ENCOUNTER — Encounter (HOSPITAL_COMMUNITY): Payer: Self-pay | Admitting: *Deleted

## 2019-01-07 NOTE — Telephone Encounter (Signed)
Preadmission screen  

## 2019-01-17 ENCOUNTER — Other Ambulatory Visit: Payer: Self-pay | Admitting: Obstetrics and Gynecology

## 2019-01-17 NOTE — H&P (Deleted)
  The note originally documented on this encounter has been moved the the encounter in which it belongs.  

## 2019-01-17 NOTE — H&P (Signed)
Sonya Flores is a 33 y.o. female G6 P5005 at 46 1/7 weeks (EDD 01/24/19 by LMP c/w 10 week [place])  presenting for IOL at term. Prenatal care significant for h/o gestational hypertension with prior pregnancies.  BP WNL this pregnancy on baby ASA q day. She has a h/o alcoholism but sober for over 5 years.  She has a h/o anxiety but stable off medications.   Past OB Hx NSVD x 5 (6#-7#)  Past Medical History:  Diagnosis Date  . Anxiety   . Depression   . History of gestational hypertension   . History of shingles   . Kidney stone    Past Surgical History:  Procedure Laterality Date  . NO PAST SURGERIES     Family History: family history includes Cancer in her maternal grandfather and mother; Heart failure in her maternal grandfather; Hyperlipidemia in her mother; Hypertension in her maternal grandfather and mother; Osteoarthritis in her maternal grandfather and mother. Social History:  reports that she quit smoking about 4 years ago. She smoked 0.25 packs per day. She has never used smokeless tobacco. She reports previous alcohol use. She reports that she does not use drugs.     Maternal Diabetes: No Genetic Screening: Normal Maternal Ultrasounds/Referrals: Normal Fetal Ultrasounds or other Referrals:  None Maternal Substance Abuse:  No Significant Maternal Medications:  None Significant Maternal Lab Results:  None Other Comments:  None  Review of Systems  Cardiovascular: Negative for chest pain.  Gastrointestinal: Negative for abdominal pain.  Neurological: Negative for headaches.   Maternal Medical History:  Contractions: Frequency: irregular.   Perceived severity is mild.    Fetal activity: Perceived fetal activity is normal.    Prenatal complications: no prenatal complications Prenatal Complications - Diabetes: none.      Last menstrual period 10/21/2017. Maternal Exam:  Uterine Assessment: Contraction strength is mild.  Contraction frequency is irregular.    Abdomen: Patient reports no abdominal tenderness. Fetal presentation: vertex  Introitus: Normal vulva. Normal vagina.    Physical Exam  Constitutional: She appears well-developed.  Cardiovascular: Normal rate and normal heart sounds.  GI: Soft.  Genitourinary:    Vulva and vagina normal.   Musculoskeletal: Normal range of motion.  Neurological: She is alert.  Psychiatric: She has a normal mood and affect.    Prenatal labs: ABO, Rh: A/Positive/-- (09/25 0000) Antibody: Negative (09/25 0000) Rubella: Immune (09/25 0000) RPR: Nonreactive (09/25 0000)  HBsAg: Negative (09/25 0000)  HIV: Non-reactive (09/25 0000)  GBS: Negative (03/09 0000)  First trimester screen negative One hour GCT 64 CF negative  Assessment/Plan: Pt for IOL at term.  Plan AROM and pitocin, epidural prn.    Oliver Pila 01/17/2019, 9:53 PM

## 2019-01-18 ENCOUNTER — Inpatient Hospital Stay (HOSPITAL_COMMUNITY): Payer: Medicaid Other

## 2019-01-18 ENCOUNTER — Encounter (HOSPITAL_COMMUNITY)
Admission: AD | Disposition: A | Discharge status: Home / Self Care | Payer: Self-pay | Source: Home / Self Care | Attending: Obstetrics and Gynecology

## 2019-01-18 ENCOUNTER — Inpatient Hospital Stay (HOSPITAL_COMMUNITY): Payer: Medicaid Other | Admitting: Anesthesiology

## 2019-01-18 ENCOUNTER — Encounter (HOSPITAL_COMMUNITY): Payer: Self-pay

## 2019-01-18 ENCOUNTER — Other Ambulatory Visit: Payer: Self-pay

## 2019-01-18 ENCOUNTER — Inpatient Hospital Stay (HOSPITAL_COMMUNITY)
Admission: AD | Admit: 2019-01-18 | Discharge: 2019-01-20 | DRG: 785 | Disposition: A | Discharge status: Home / Self Care | Payer: Medicaid Other | Attending: Obstetrics and Gynecology | Admitting: Obstetrics and Gynecology

## 2019-01-18 DIAGNOSIS — Z302 Encounter for sterilization: Secondary | ICD-10-CM

## 2019-01-18 DIAGNOSIS — Z87891 Personal history of nicotine dependence: Secondary | ICD-10-CM

## 2019-01-18 DIAGNOSIS — Z3A39 39 weeks gestation of pregnancy: Secondary | ICD-10-CM | POA: Diagnosis not present

## 2019-01-18 DIAGNOSIS — Z98891 History of uterine scar from previous surgery: Secondary | ICD-10-CM

## 2019-01-18 DIAGNOSIS — O26893 Other specified pregnancy related conditions, third trimester: Secondary | ICD-10-CM | POA: Diagnosis present

## 2019-01-18 LAB — ABO/RH: ABO/RH(D): A POS

## 2019-01-18 LAB — CBC
HCT: 36.3 % (ref 36.0–46.0)
Hemoglobin: 11.9 g/dL — ABNORMAL LOW (ref 12.0–15.0)
MCH: 30.7 pg (ref 26.0–34.0)
MCHC: 32.8 g/dL (ref 30.0–36.0)
MCV: 93.6 fL (ref 80.0–100.0)
Platelets: 256 10*3/uL (ref 150–400)
RBC: 3.88 MIL/uL (ref 3.87–5.11)
RDW: 12.9 % (ref 11.5–15.5)
WBC: 9.5 10*3/uL (ref 4.0–10.5)
nRBC: 0 % (ref 0.0–0.2)

## 2019-01-18 LAB — TYPE AND SCREEN
ABO/RH(D): A POS
Antibody Screen: NEGATIVE

## 2019-01-18 LAB — RPR: RPR Ser Ql: NONREACTIVE

## 2019-01-18 SURGERY — Surgical Case
Anesthesia: Epidural | Wound class: Clean Contaminated

## 2019-01-18 MED ORDER — OXYTOCIN 40 UNITS IN NORMAL SALINE INFUSION - SIMPLE MED
INTRAVENOUS | Status: AC
  Filled 2019-01-18: qty 1000

## 2019-01-18 MED ORDER — SODIUM CHLORIDE 0.9 % IR SOLN
Status: DC | PRN
  Administered 2019-01-18: 1

## 2019-01-18 MED ORDER — SIMETHICONE 80 MG PO CHEW
80.0000 mg | CHEWABLE_TABLET | ORAL | Status: DC
  Administered 2019-01-19 (×2): 80 mg via ORAL
  Filled 2019-01-18 (×2): qty 1

## 2019-01-18 MED ORDER — OXYTOCIN 40 UNITS IN NORMAL SALINE INFUSION - SIMPLE MED: 2.5000 [IU]/h | INTRAVENOUS | Status: AC

## 2019-01-18 MED ORDER — PHENYLEPHRINE 40 MCG/ML (10ML) SYRINGE FOR IV PUSH (FOR BLOOD PRESSURE SUPPORT)
80.0000 ug | PREFILLED_SYRINGE | INTRAVENOUS | Status: DC | PRN
  Filled 2019-01-18: qty 10

## 2019-01-18 MED ORDER — OXYTOCIN BOLUS FROM INFUSION: 500.0000 mL | Freq: Once | INTRAVENOUS | Status: DC

## 2019-01-18 MED ORDER — KETOROLAC TROMETHAMINE 30 MG/ML IJ SOLN: 30.0000 mg | Freq: Four times a day (QID) | INTRAMUSCULAR | Status: AC | PRN

## 2019-01-18 MED ORDER — MEPERIDINE HCL 25 MG/ML IJ SOLN
INTRAMUSCULAR | Status: AC
  Filled 2019-01-18: qty 1

## 2019-01-18 MED ORDER — NALBUPHINE HCL 10 MG/ML IJ SOLN: 5.0000 mg | INTRAMUSCULAR | Status: DC | PRN

## 2019-01-18 MED ORDER — LIDOCAINE HCL (PF) 1 % IJ SOLN
INTRAMUSCULAR | Status: DC | PRN
  Administered 2019-01-18: 6 mL via EPIDURAL

## 2019-01-18 MED ORDER — OXYCODONE-ACETAMINOPHEN 5-325 MG PO TABS: 1.0000 | ORAL_TABLET | ORAL | Status: DC | PRN

## 2019-01-18 MED ORDER — ACETAMINOPHEN 325 MG PO TABS: 650.0000 mg | ORAL_TABLET | ORAL | Status: DC | PRN

## 2019-01-18 MED ORDER — LACTATED RINGERS IV SOLN
INTRAVENOUS | Status: DC
  Administered 2019-01-18 – 2019-01-19 (×2): via INTRAVENOUS

## 2019-01-18 MED ORDER — METOCLOPRAMIDE HCL 5 MG/ML IJ SOLN
INTRAMUSCULAR | Status: AC
  Filled 2019-01-18: qty 2

## 2019-01-18 MED ORDER — DIPHENHYDRAMINE HCL 25 MG PO CAPS: 25.0000 mg | ORAL_CAPSULE | ORAL | Status: DC | PRN

## 2019-01-18 MED ORDER — SODIUM CHLORIDE 0.9% FLUSH: 3.0000 mL | INTRAVENOUS | Status: DC | PRN

## 2019-01-18 MED ORDER — OXYTOCIN 40 UNITS IN NORMAL SALINE INFUSION - SIMPLE MED
1.0000 m[IU]/min | INTRAVENOUS | Status: DC
  Administered 2019-01-18 (×2): 50 mL via INTRAVENOUS
  Administered 2019-01-18: 2 m[IU]/min via INTRAVENOUS
  Filled 2019-01-18: qty 1000

## 2019-01-18 MED ORDER — CEFAZOLIN SODIUM-DEXTROSE 2-4 GM/100ML-% IV SOLN
INTRAVENOUS | Status: AC
  Filled 2019-01-18: qty 100

## 2019-01-18 MED ORDER — OXYCODONE HCL 5 MG PO TABS
5.0000 mg | ORAL_TABLET | ORAL | Status: DC | PRN
  Administered 2019-01-19: 5 mg via ORAL
  Administered 2019-01-19: 10 mg via ORAL
  Administered 2019-01-19 (×2): 5 mg via ORAL
  Administered 2019-01-20 (×2): 10 mg via ORAL
  Filled 2019-01-18 (×2): qty 2
  Filled 2019-01-18: qty 1
  Filled 2019-01-18: qty 2
  Filled 2019-01-18 (×2): qty 1

## 2019-01-18 MED ORDER — FENTANYL-BUPIVACAINE-NACL 0.5-0.125-0.9 MG/250ML-% EP SOLN
12.0000 mL/h | EPIDURAL | Status: DC | PRN
  Filled 2019-01-18: qty 250

## 2019-01-18 MED ORDER — PRENATAL MULTIVITAMIN CH
1.0000 | ORAL_TABLET | Freq: Every day | ORAL | Status: DC
  Filled 2019-01-18: qty 1

## 2019-01-18 MED ORDER — NALBUPHINE HCL 10 MG/ML IJ SOLN: 5.0000 mg | Freq: Once | INTRAMUSCULAR | Status: DC | PRN

## 2019-01-18 MED ORDER — SODIUM CHLORIDE (PF) 0.9 % IJ SOLN
INTRAMUSCULAR | Status: DC | PRN
  Administered 2019-01-18: 14 mL/h via EPIDURAL

## 2019-01-18 MED ORDER — DIPHENHYDRAMINE HCL 50 MG/ML IJ SOLN: 12.5000 mg | INTRAMUSCULAR | Status: DC | PRN

## 2019-01-18 MED ORDER — PHENYLEPHRINE 40 MCG/ML (10ML) SYRINGE FOR IV PUSH (FOR BLOOD PRESSURE SUPPORT)
PREFILLED_SYRINGE | INTRAVENOUS | Status: AC
  Filled 2019-01-18: qty 10

## 2019-01-18 MED ORDER — ACETAMINOPHEN 325 MG PO TABS
650.0000 mg | ORAL_TABLET | ORAL | Status: DC | PRN
  Administered 2019-01-19 (×4): 650 mg via ORAL
  Filled 2019-01-18 (×4): qty 2

## 2019-01-18 MED ORDER — PHENYLEPHRINE 40 MCG/ML (10ML) SYRINGE FOR IV PUSH (FOR BLOOD PRESSURE SUPPORT)
PREFILLED_SYRINGE | INTRAVENOUS | Status: DC | PRN
  Administered 2019-01-18 (×6): 80 ug via INTRAVENOUS

## 2019-01-18 MED ORDER — NALOXONE HCL 0.4 MG/ML IJ SOLN: 0.4000 mg | INTRAMUSCULAR | Status: DC | PRN

## 2019-01-18 MED ORDER — DIBUCAINE 1 % RE OINT: 1.0000 "application " | TOPICAL_OINTMENT | RECTAL | Status: DC | PRN

## 2019-01-18 MED ORDER — SCOPOLAMINE 1 MG/3DAYS TD PT72
MEDICATED_PATCH | TRANSDERMAL | Status: DC | PRN
  Administered 2019-01-18: 1 via TRANSDERMAL

## 2019-01-18 MED ORDER — DEXAMETHASONE SODIUM PHOSPHATE 10 MG/ML IJ SOLN
INTRAMUSCULAR | Status: AC
  Filled 2019-01-18: qty 1

## 2019-01-18 MED ORDER — MORPHINE SULFATE (PF) 0.5 MG/ML IJ SOLN
INTRAMUSCULAR | Status: DC | PRN
  Administered 2019-01-18: 3 mg via EPIDURAL

## 2019-01-18 MED ORDER — OXYTOCIN 40 UNITS IN NORMAL SALINE INFUSION - SIMPLE MED: 2.5000 [IU]/h | INTRAVENOUS | Status: DC

## 2019-01-18 MED ORDER — MORPHINE SULFATE (PF) 0.5 MG/ML IJ SOLN
INTRAMUSCULAR | Status: AC
  Filled 2019-01-18: qty 10

## 2019-01-18 MED ORDER — LACTATED RINGERS IV SOLN
500.0000 mL | Freq: Once | INTRAVENOUS | Status: AC
  Administered 2019-01-18: 500 mL via INTRAVENOUS

## 2019-01-18 MED ORDER — LACTATED RINGERS IV SOLN: 500.0000 mL | INTRAVENOUS | Status: DC | PRN

## 2019-01-18 MED ORDER — TETANUS-DIPHTH-ACELL PERTUSSIS 5-2.5-18.5 LF-MCG/0.5 IM SUSP: 0.5000 mL | Freq: Once | INTRAMUSCULAR | Status: DC

## 2019-01-18 MED ORDER — PHENYLEPHRINE 40 MCG/ML (10ML) SYRINGE FOR IV PUSH (FOR BLOOD PRESSURE SUPPORT): 80.0000 ug | PREFILLED_SYRINGE | INTRAVENOUS | Status: DC | PRN

## 2019-01-18 MED ORDER — METOCLOPRAMIDE HCL 5 MG/ML IJ SOLN
INTRAMUSCULAR | Status: DC | PRN
  Administered 2019-01-18: 10 mg via INTRAVENOUS

## 2019-01-18 MED ORDER — DIPHENHYDRAMINE HCL 25 MG PO CAPS
25.0000 mg | ORAL_CAPSULE | Freq: Four times a day (QID) | ORAL | Status: DC | PRN
  Administered 2019-01-19: 25 mg via ORAL
  Filled 2019-01-18: qty 1

## 2019-01-18 MED ORDER — FENTANYL CITRATE (PF) 100 MCG/2ML IJ SOLN
50.0000 ug | INTRAMUSCULAR | Status: DC | PRN
  Administered 2019-01-18: 50 ug via INTRAVENOUS

## 2019-01-18 MED ORDER — ONDANSETRON HCL 4 MG/2ML IJ SOLN
INTRAMUSCULAR | Status: DC | PRN
  Administered 2019-01-18: 4 mg via INTRAVENOUS

## 2019-01-18 MED ORDER — FENTANYL CITRATE (PF) 100 MCG/2ML IJ SOLN
INTRAMUSCULAR | Status: AC
  Filled 2019-01-18: qty 2

## 2019-01-18 MED ORDER — LACTATED RINGERS IV SOLN
INTRAVENOUS | Status: DC
  Administered 2019-01-18 (×4): via INTRAVENOUS

## 2019-01-18 MED ORDER — KETOROLAC TROMETHAMINE 30 MG/ML IJ SOLN
30.0000 mg | Freq: Four times a day (QID) | INTRAMUSCULAR | Status: AC | PRN
  Administered 2019-01-18: 30 mg via INTRAVENOUS

## 2019-01-18 MED ORDER — EPHEDRINE 5 MG/ML INJ: 10.0000 mg | INTRAVENOUS | Status: DC | PRN

## 2019-01-18 MED ORDER — FENTANYL CITRATE (PF) 100 MCG/2ML IJ SOLN
50.0000 ug | INTRAMUSCULAR | Status: DC | PRN
  Administered 2019-01-18: 100 ug via INTRAVENOUS
  Filled 2019-01-18: qty 2

## 2019-01-18 MED ORDER — ZOLPIDEM TARTRATE 5 MG PO TABS: 5.0000 mg | ORAL_TABLET | Freq: Every evening | ORAL | Status: DC | PRN

## 2019-01-18 MED ORDER — COCONUT OIL OIL: 1.0000 "application " | TOPICAL_OIL | Status: DC | PRN

## 2019-01-18 MED ORDER — SIMETHICONE 80 MG PO CHEW
80.0000 mg | CHEWABLE_TABLET | Freq: Three times a day (TID) | ORAL | Status: DC
  Administered 2019-01-19 – 2019-01-20 (×4): 80 mg via ORAL
  Filled 2019-01-18 (×4): qty 1

## 2019-01-18 MED ORDER — LIDOCAINE-EPINEPHRINE (PF) 2 %-1:200000 IJ SOLN
INTRAMUSCULAR | Status: AC
  Filled 2019-01-18: qty 20

## 2019-01-18 MED ORDER — CEFAZOLIN SODIUM-DEXTROSE 2-3 GM-%(50ML) IV SOLR
INTRAVENOUS | Status: DC | PRN
  Administered 2019-01-18: 2 g via INTRAVENOUS

## 2019-01-18 MED ORDER — TERBUTALINE SULFATE 1 MG/ML IJ SOLN: 0.2500 mg | Freq: Once | INTRAMUSCULAR | Status: DC | PRN

## 2019-01-18 MED ORDER — LIDOCAINE-EPINEPHRINE (PF) 2 %-1:200000 IJ SOLN
INTRAMUSCULAR | Status: DC | PRN
  Administered 2019-01-18 (×3): 5 mL via EPIDURAL

## 2019-01-18 MED ORDER — DEXAMETHASONE SODIUM PHOSPHATE 10 MG/ML IJ SOLN
INTRAMUSCULAR | Status: DC | PRN
  Administered 2019-01-18: 10 mg via INTRAVENOUS

## 2019-01-18 MED ORDER — WITCH HAZEL-GLYCERIN EX PADS: 1.0000 "application " | MEDICATED_PAD | CUTANEOUS | Status: DC | PRN

## 2019-01-18 MED ORDER — SOD CITRATE-CITRIC ACID 500-334 MG/5ML PO SOLN
30.0000 mL | ORAL | Status: DC | PRN
  Administered 2019-01-18: 30 mL via ORAL
  Filled 2019-01-18 (×2): qty 15

## 2019-01-18 MED ORDER — SIMETHICONE 80 MG PO CHEW: 80.0000 mg | CHEWABLE_TABLET | ORAL | Status: DC | PRN

## 2019-01-18 MED ORDER — NALOXONE HCL 4 MG/10ML IJ SOLN
1.0000 ug/kg/h | INTRAVENOUS | Status: DC | PRN
  Filled 2019-01-18: qty 5

## 2019-01-18 MED ORDER — ONDANSETRON HCL 4 MG/2ML IJ SOLN: 4.0000 mg | Freq: Three times a day (TID) | INTRAMUSCULAR | Status: DC | PRN

## 2019-01-18 MED ORDER — MEPERIDINE HCL 25 MG/ML IJ SOLN
6.2500 mg | INTRAMUSCULAR | Status: DC | PRN
  Administered 2019-01-18: 6.25 mg via INTRAVENOUS

## 2019-01-18 MED ORDER — SENNOSIDES-DOCUSATE SODIUM 8.6-50 MG PO TABS
2.0000 | ORAL_TABLET | ORAL | Status: DC
  Administered 2019-01-19 (×2): 2 via ORAL
  Filled 2019-01-18 (×2): qty 2

## 2019-01-18 MED ORDER — OXYCODONE-ACETAMINOPHEN 5-325 MG PO TABS: 2.0000 | ORAL_TABLET | ORAL | Status: DC | PRN

## 2019-01-18 MED ORDER — SCOPOLAMINE 1 MG/3DAYS TD PT72: 1.0000 | MEDICATED_PATCH | Freq: Once | TRANSDERMAL | Status: DC

## 2019-01-18 MED ORDER — NALBUPHINE HCL 10 MG/ML IJ SOLN
5.0000 mg | INTRAMUSCULAR | Status: DC | PRN
  Administered 2019-01-18: 5 mg via INTRAVENOUS
  Filled 2019-01-18: qty 1

## 2019-01-18 MED ORDER — LACTATED RINGERS AMNIOINFUSION
INTRAVENOUS | Status: DC
  Administered 2019-01-18: 13:00:00 via INTRAUTERINE

## 2019-01-18 MED ORDER — MENTHOL 3 MG MT LOZG: 1.0000 | LOZENGE | OROMUCOSAL | Status: DC | PRN

## 2019-01-18 MED ORDER — KETOROLAC TROMETHAMINE 30 MG/ML IJ SOLN
INTRAMUSCULAR | Status: AC
  Filled 2019-01-18: qty 1

## 2019-01-18 MED ORDER — SCOPOLAMINE 1 MG/3DAYS TD PT72
MEDICATED_PATCH | TRANSDERMAL | Status: AC
  Filled 2019-01-18: qty 1

## 2019-01-18 MED ORDER — ONDANSETRON HCL 4 MG/2ML IJ SOLN
INTRAMUSCULAR | Status: AC
  Filled 2019-01-18: qty 2

## 2019-01-18 MED ORDER — LIDOCAINE HCL (PF) 1 % IJ SOLN: 30.0000 mL | INTRAMUSCULAR | Status: DC | PRN

## 2019-01-18 MED ORDER — IBUPROFEN 800 MG PO TABS
800.0000 mg | ORAL_TABLET | Freq: Three times a day (TID) | ORAL | Status: DC
  Administered 2019-01-18 – 2019-01-20 (×5): 800 mg via ORAL
  Filled 2019-01-18 (×5): qty 1

## 2019-01-18 MED ORDER — ONDANSETRON HCL 4 MG/2ML IJ SOLN
4.0000 mg | Freq: Four times a day (QID) | INTRAMUSCULAR | Status: DC | PRN
  Administered 2019-01-18: 4 mg via INTRAVENOUS
  Filled 2019-01-18: qty 2

## 2019-01-18 SURGICAL SUPPLY — 38 items
APL SKNCLS STERI-STRIP NONHPOA (GAUZE/BANDAGES/DRESSINGS) ×1
BENZOIN TINCTURE PRP APPL 2/3 (GAUZE/BANDAGES/DRESSINGS) ×2 IMPLANT
CHLORAPREP W/TINT 26ML (MISCELLANEOUS) ×3 IMPLANT
CLAMP CORD UMBIL (MISCELLANEOUS) IMPLANT
CLOSURE STERI STRIP 1/2 X4 (GAUZE/BANDAGES/DRESSINGS) ×2 IMPLANT
CLOSURE WOUND 1/2 X4 (GAUZE/BANDAGES/DRESSINGS)
CLOTH BEACON ORANGE TIMEOUT ST (SAFETY) ×3 IMPLANT
DRSG OPSITE POSTOP 4X10 (GAUZE/BANDAGES/DRESSINGS) ×3 IMPLANT
ELECT REM PT RETURN 9FT ADLT (ELECTROSURGICAL) ×3
ELECTRODE REM PT RTRN 9FT ADLT (ELECTROSURGICAL) ×1 IMPLANT
EXTRACTOR VACUUM KIWI (MISCELLANEOUS) IMPLANT
GLOVE BIO SURGEON STRL SZ 6.5 (GLOVE) ×2 IMPLANT
GLOVE BIO SURGEONS STRL SZ 6.5 (GLOVE) ×1
GLOVE BIOGEL PI IND STRL 7.0 (GLOVE) ×1 IMPLANT
GLOVE BIOGEL PI INDICATOR 7.0 (GLOVE) ×2
GOWN STRL REUS W/TWL LRG LVL3 (GOWN DISPOSABLE) ×6 IMPLANT
KIT ABG SYR 3ML LUER SLIP (SYRINGE) IMPLANT
NDL HYPO 25X5/8 SAFETYGLIDE (NEEDLE) IMPLANT
NEEDLE HYPO 25X5/8 SAFETYGLIDE (NEEDLE) IMPLANT
NS IRRIG 1000ML POUR BTL (IV SOLUTION) ×3 IMPLANT
PACK C SECTION WH (CUSTOM PROCEDURE TRAY) ×3 IMPLANT
PAD OB MATERNITY 4.3X12.25 (PERSONAL CARE ITEMS) ×3 IMPLANT
PENCIL SMOKE EVAC W/HOLSTER (ELECTROSURGICAL) ×3 IMPLANT
RTRCTR C-SECT PINK 25CM LRG (MISCELLANEOUS) ×3 IMPLANT
STRIP CLOSURE SKIN 1/2X4 (GAUZE/BANDAGES/DRESSINGS) IMPLANT
SUT CHROMIC 1 CTX 36 (SUTURE) ×6 IMPLANT
SUT PLAIN 0 NONE (SUTURE) ×2 IMPLANT
SUT PLAIN 2 0 XLH (SUTURE) ×3 IMPLANT
SUT VIC AB 0 CT1 27 (SUTURE) ×6
SUT VIC AB 0 CT1 27XBRD ANBCTR (SUTURE) ×2 IMPLANT
SUT VIC AB 2-0 CT1 27 (SUTURE) ×9
SUT VIC AB 2-0 CT1 TAPERPNT 27 (SUTURE) ×1 IMPLANT
SUT VIC AB 3-0 CT1 27 (SUTURE)
SUT VIC AB 3-0 CT1 TAPERPNT 27 (SUTURE) IMPLANT
SUT VIC AB 4-0 KS 27 (SUTURE) ×3 IMPLANT
TOWEL OR 17X24 6PK STRL BLUE (TOWEL DISPOSABLE) ×3 IMPLANT
TRAY FOLEY W/BAG SLVR 14FR LF (SET/KITS/TRAYS/PACK) ×3 IMPLANT
WATER STERILE IRR 1000ML POUR (IV SOLUTION) ×3 IMPLANT

## 2019-01-18 NOTE — Anesthesia Postprocedure Evaluation (Signed)
Anesthesia Post Note  Patient: [name]  Procedure(s) Performed: CESAREAN SECTION WITH BILATERAL TUBAL LIGATION (N/A )     Patient location during evaluation: Mother Baby Anesthesia Type: Epidural Level of consciousness: awake and alert Pain management: pain level controlled Vital Signs Assessment: post-procedure vital signs reviewed and stable Respiratory status: spontaneous breathing, nonlabored ventilation and respiratory function stable Cardiovascular status: stable Postop Assessment: no headache, no backache and epidural receding Anesthetic complications: no    Last Vitals:  Vitals:   01/18/19 1700 01/18/19 1715  BP: 106/72 (!) 117/91  Pulse: 78 86  Resp: 17 18  Temp:  36.7 C  SpO2: 100% 96%    Last Pain:  Vitals:   01/18/19 1715  TempSrc: Oral  PainSc:    Pain Goal:    LLE Motor Response: Purposeful movement (01/18/19 1700) LLE Sensation: Tingling (01/18/19 1700) RLE Motor Response: Purposeful movement (01/18/19 1700) RLE Sensation: Tingling (01/18/19 1700)     Epidural/Spinal Function Cutaneous sensation: Tingles (01/18/19 1700), Patient able to flex knees: Yes (01/18/19 1700), Patient able to lift hips off bed: No (01/18/19 1700), Back pain beyond tenderness at insertion site: No (01/18/19 1700), Progressively worsening motor and/or sensory loss: No (01/18/19 1700), Bowel and/or bladder incontinence post epidural: No (01/18/19 1700)  Ruqayyah Lute

## 2019-01-18 NOTE — Anesthesia Procedure Notes (Signed)
Epidural Patient location during procedure: OB Start time: 01/18/2019 10:50 AM End time: 01/18/2019 10:55 AM  Staffing Anesthesiologist: Bethena Midget, MD  Preanesthetic Checklist Completed: patient identified, site marked, surgical consent, pre-op evaluation, timeout performed, IV checked, risks and benefits discussed and monitors and equipment checked  Epidural Patient position: sitting Prep: site prepped and draped and DuraPrep Patient monitoring: continuous pulse ox and blood pressure Approach: midline Location: L3-L4 Injection technique: LOR air  Needle:  Needle type: Tuohy  Needle gauge: 17 G Needle length: 9 cm and 9 Needle insertion depth: 5 cm cm Catheter type: closed end flexible Catheter size: 19 Gauge Catheter at skin depth: 10 cm Test dose: negative  Assessment Events: blood not aspirated, injection not painful, no injection resistance, negative IV test and no paresthesia

## 2019-01-18 NOTE — Transfer of Care (Signed)
Immediate Anesthesia Transfer of Care Note  Patient: Sonya Flores  Procedure(s) Performed: CESAREAN SECTION WITH BILATERAL TUBAL LIGATION (N/A )  Patient Location: PACU  Anesthesia Type:Epidural  Level of Consciousness: awake, alert  and oriented  Airway & Oxygen Therapy: Patient Spontanous Breathing  Post-op Assessment: Report given to RN and Post -op Vital signs reviewed and stable  Post vital signs: Reviewed and stable  Last Vitals:  Vitals Value Taken Time  BP 87/55 01/18/2019  3:36 PM  Temp    Pulse    Resp 19 01/18/2019  3:39 PM  SpO2    Vitals shown include unvalidated device data.  Last Pain:  Vitals:   01/18/19 1331  TempSrc: Oral  PainSc:          Complications: No apparent anesthesia complications

## 2019-01-18 NOTE — Progress Notes (Signed)
Patient ID: Sonya Flores, female   DOB: 07/18/1986, 33 y.o.   MRN: [id number] D/w pt and husband circumcision and they desire to proceed in hospital and have paid at office.

## 2019-01-18 NOTE — Progress Notes (Signed)
Patient ID: Sonya Flores, female   DOB: 1985/11/04, 33 y.o.   MRN: [id number] Pt comfortable  FHR Category 1 Cervix 50/2-3/-3  Was going to attempt AROM with FSE but when station a bit high, deferred.  Will recheck in a few hours

## 2019-01-18 NOTE — Op Note (Signed)
Operative Note    Preoperative Diagnosis Term pregnancy at 39 1/7 weeks Category 2-3 fetal heart tracing Desires permanent sterility  Postoperative Diagnosis Same with double nuchal cord and occult cord by face  Procedure Primary low transverse c-section with 2-layer closure of uterus  Bilateral tubal sterilization with distal salpingectomies  Surgeon Huel Cote, MD  Anesthesia Epidural  Fluids: EBL UOP  IVF  Findings A viable female infant in the vertex presentation with a tight nuchal cord x 2 and occult loop by the face.  Apgars 8,9 Cord pH 7.2 Normal uterus, tubes and ovaries  Specimen Placenta to L&D, Tubal segments to pathology  Procedure Note  Patient was taken to the operating room where epidural anesthesia was found to be adequate by Allis clamp test. She was prepped and draped in the normal sterile fashion in the dorsal supine position with a leftward tilt. An appropriate time out was performed. A Pfannenstiel skin incision was then made with the scalpel and carried through to the underlying layer of fascia by sharp dissection and Bovie cautery. The fascia was nicked in the midline and the incision was extended laterally with Mayo scissors. The inferior aspect of the incision was grasped Coker clamps and dissected off the underlying rectus muscles. In a similar fashion the superior aspect was dissected off the rectus muscles. Rectus muscles were separated in the midline and the peritoneal cavity entered bluntly. The peritoneal incision was then extended both superiorly and inferiorly with careful attention to avoid both bowel and bladder. The Alexis self-retaining wound retractor was then placed within the incision and the lower uterine segment exposed. The bladder flap was developed with Metzenbaum scissors and pushed away from the lower uterine segment. The lower uterine segment was then incised in a transverse fashion and the cavity itself  entered bluntly. The incision was extended bluntly. The infant's head was then lifted and delivered from the incision without difficulty.  A nuchal cord x 2 was reduced.  The remainder of the infant delivered and the nose and mouth bulb suctioned with the cord clamped and cut as well after 1 minute delayed cord clamping. The infant was handed off to the waiting pediatricians. The placenta was then spontaneously expressed from the uterus and the uterus cleared of all clots and debris with moist lap sponge. The uterine incision was then repaired in 2 layers the first layer was a running locked layer 1-0 chromic and the second an imbricating layer of the same suture. The tubes and ovaries were inspected and the gutters cleared of all clots and debris. The uterine incision was inspected and found to be hemostatic.  Attention was turned to the fallopian tubes which were traced out to their fimbrated end and clamped across the distal third with a Kelly clamp.  The distal end was then amputated and the free pedicle secured with suture ligature x 2 of 2-3 vicryl bilaterally.  All was reinspected an hemostatic. All instruments and sponges as well as the Alexis retractor were then removed from the abdomen. The rectus muscles and peritoneum were then reapproximated with a running suture of 2-0 Vicryl. The fascia was then closed with 0 Vicryl in a running fashion. Subcutaneous tissue was reapproximated with 3-0 plain in a running fashion. The skin was closed with a subcuticular stitch of 4-0 Vicryl on a Keith needle and then reinforced with benzoin and Steri-Strips. At the conclusion of the procedure all instruments and sponge counts were correct. Patient was taken to the  recovery room in good condition with her baby accompanying her skin to skin.

## 2019-01-18 NOTE — Progress Notes (Signed)
Patient ID: Sonya Flores, female   DOB: 01-21-1986, 33 y.o.   MRN: [id number] Pt began to have deep repetitive variable decels at 1200pm.  She progressed to 8-9 cm and 0 station rapidly so interventions were tried of multiple position changes, amnioinfusion, and the pitocin discontinued.  She got to a reducible anterior lip so we attempted pushing with some recovery between contractions of FHR but was increasingly prolonged.   The variability began to be reduced and despite good maternal effort the vertex did not advance beyond a 0 station.  We attempted pushing off and on and tried to let the patient passively labor with no improvement in FHR D/w pt and husband that I felt we should proceed with c-section and they are agreeable.  Risks and benefits reviewed including bleeding, infection and damage to bowel/bladder.  The patient would like to have tubal sterilization if able and is consented for this.

## 2019-01-18 NOTE — Anesthesia Preprocedure Evaluation (Signed)
Anesthesia Evaluation  Patient identified by MRN, date of birth, ID band Patient awake    Reviewed: Allergy & Precautions, NPO status , Patient's Chart, lab work & pertinent test results  Airway Mallampati: II  TM Distance: >3 FB Neck ROM: Full    Dental no notable dental hx.    Pulmonary neg pulmonary ROS, former smoker,    Pulmonary exam normal breath sounds clear to auscultation       Cardiovascular negative cardio ROS Normal cardiovascular exam Rhythm:Regular Rate:Normal     Neuro/Psych negative neurological ROS  negative psych ROS   GI/Hepatic negative GI ROS, Neg liver ROS,   Endo/Other  negative endocrine ROS  Renal/GU negative Renal ROS  negative genitourinary   Musculoskeletal negative musculoskeletal ROS (+)   Abdominal   Peds negative pediatric ROS (+)  Hematology negative hematology ROS (+)   Anesthesia Other Findings   Reproductive/Obstetrics negative OB ROS                             Anesthesia Physical Anesthesia Plan  ASA: II  Anesthesia Plan: Epidural   Post-op Pain Management:    Induction:   PONV Risk Score and Plan:   Airway Management Planned:   Additional Equipment:   Intra-op Plan:   Post-operative Plan:   Informed Consent: I have reviewed the patients History and Physical, chart, labs and discussed the procedure including the risks, benefits and alternatives for the proposed anesthesia with the patient or authorized representative who has indicated his/her understanding and acceptance.     Dental Advisory Given  Plan Discussed with: CRNA, Anesthesiologist and Surgeon  Anesthesia Plan Comments: (Labs checked- platelets confirmed with RN in room. Fetal heart tracing, per RN, reported to be stable enough for sitting procedure. Discussed epidural, and patient consents to the procedure:  included risk of possible headache,backache, failed block,  allergic reaction, and nerve injury. This patient was asked if she had any questions or concerns before the procedure started.)        Anesthesia Quick Evaluation

## 2019-01-19 ENCOUNTER — Encounter (HOSPITAL_COMMUNITY): Payer: Self-pay | Admitting: Obstetrics and Gynecology

## 2019-01-19 LAB — CBC
HCT: 30.1 % — ABNORMAL LOW (ref 36.0–46.0)
Hemoglobin: 9.8 g/dL — ABNORMAL LOW (ref 12.0–15.0)
MCH: 30.2 pg (ref 26.0–34.0)
MCHC: 32.6 g/dL (ref 30.0–36.0)
MCV: 92.9 fL (ref 80.0–100.0)
Platelets: 228 10*3/uL (ref 150–400)
RBC: 3.24 MIL/uL — ABNORMAL LOW (ref 3.87–5.11)
RDW: 12.7 % (ref 11.5–15.5)
WBC: 13.1 10*3/uL — ABNORMAL HIGH (ref 4.0–10.5)
nRBC: 0 % (ref 0.0–0.2)

## 2019-01-19 NOTE — Lactation Note (Signed)
This note was copied from a baby's chart. Lactation Consultation Note Baby 13 hrs old. Mom's 6th baby. Mom BF her 33 yr old for 16 months w/o difficulty. Mom states this baby is BF great w/no difficulty. Denies painful latches.  Mom has large breast, large everted nipples. Mom states she doesn't need any assistance w/BF at this time.  Encouraged mom to call if needed. Reminded of cluster feeding, STS, I&O, supply and demand. Lactation brochure left at bedside.  Patient Name: Sonya Flores [id number] Date: 01/19/2019 Reason for consult: Initial assessment   Maternal Data Has patient been taught Hand Expression?: Yes Does the patient have breastfeeding experience prior to this delivery?: Yes  Feeding    LATCH Score       Type of Nipple: Everted at rest and after stimulation  Comfort (Breast/Nipple): Soft / non-tender        Interventions Interventions: Breast feeding basics reviewed  Lactation Tools Discussed/Used WIC Program: No   Consult Status Consult Status: PRN Date: 01/20/19 Follow-up type: In-patient    Charyl Dancer 01/19/2019, 4:25 AM

## 2019-01-19 NOTE — Clinical Social Work Maternal (Signed)
CLINICAL SOCIAL WORK MATERNAL/CHILD NOTE  Patient Details  Name: Sonya Flores MRN: [id number] Date of Birth: 02/01/1986  Date:  2019-05-04  Clinical Social Worker Initiating Note:  Ollen Barges Date/Time: Initiated:  01/19/19/1006     Child's Name:  Sonya Flores   Biological Parents:  Mother, Father(Kaly Curt Bears and Maddox Hlavaty DOB: 01/17/1988)   Need for Interpreter:  None   Reason for Referral:  Current Substance Use/Substance Use During Pregnancy , Behavioral Health Concerns   Address:  [place] [address]    Phone number:  [phone] (home)     Additional phone number:   Household Members/Support Persons (HM/SP):   Household Member/Support Person 1, Household Member/Support Person 2, Household Member/Support Person 3, Household Member/Support Person 4, Household Member/Support Person 5, Household Member/Support Person 6   HM/SP Name Relationship DOB or Age  HM/SP -1 Clevon Khader FOB 01/17/1988  HM/SP -2 Wyvonna Plum (in MOB's and FOB's care) Son 09/07/2014  HM/SP -3 Physiological scientist (lives with her father- Claria Dice) Daughter 12/05/2001  HM/SP -4 Emergency planning/management officer (lives with paternal grandparents in [place] and [place]) Daughter 07/29/2004  HM/SP -5 Autumn Vaillancourt (lives with paternal grandparents in [place] and [place]) Daughter 03/11/2007  HM/SP -6 Nanetta Batty Suire (lives with [place]) Daughter 12/26/2010  HM/SP -7        HM/SP -8          Natural Supports (not living in the home):  Extended Family, Parent   Professional Supports:     Employment: Unemployed   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   Education:  Some Therapist, occupational arranged:    Museum/gallery curator Resources:  Medicaid   Other Resources:  Physicist, medical (MOB received phone call regarding food stamps during assessment. )   Cultural/Religious Considerations Which  May Impact Care:    Strengths:  Ability to meet basic needs , Home prepared for child , Pediatrician chosen   Psychotropic Medications:         Pediatrician:    [facility] ([facility])  Pediatrician List:   [facility]    Pediatrician Fax Number:    Risk Factors/Current Problems:  Substance Use , Mental Health Concerns (MOB has been sober 9 years. MOB has history of anxiety and depression.)   Cognitive State:  Able to Concentrate , Alert , Insightful , Linear Thinking    Mood/Affect:  Bright , Calm , Comfortable , Happy , Interested , Relaxed    CSW Assessment: CSW received consult for hx of anxiety and depression, positive UDS during pregnancy and MOB not having custody of some of her children.  CSW met with MOB to offer support and complete assessment.    MOB sitting up in bed with baby asleep in basinet. FOB also present at bedside. CSW introduced self and role and received verbal permission to discuss anything in front of FOB, as MOB wanted FOB present. CSW explained reason for consult and MOB expressed understanding. MOB and FOB pleasant and engaged throughout assessment. MOB stated she currently lives with FOB and their 61-year-old son. MOB confirmed she has 4 other children that have been adopted by other family members. MOB reported that her oldest child Engineer, mining) lives in [place] with her father Claria Dice),  two of her daughters (Haylie and Autumn Vaillancourt) live in [place] with their paternal grandparents (Daniel and Valerie Vaillancourt) and that her youngest daughter (Aubrie Coffel) lives locally with MOB's mother (Karen Wood).  MOB reported CPS was involved at the time due to substance use but that placement with family was voluntary. MOB informed CSW that in 2009-2010 she developed a drug problem but informed CSW  she will be 9 years sober in June. MOB very open with CSW about her history and stated "she knows she has to answer to her past". 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   CSW inquired about MOB's mental health history. MOB reported having a history of depression and anxiety around 2009-2010 but stated things improved when she met FOB. MOB denied any PPD with other children. CSW provided education regarding the baby blues period vs. perinatal mood disorders, discussed treatment and gave resources for mental health follow up if concerns arise.  CSW recommends self-evaluation during the postpartum time period using the New Mom Checklist from Postpartum Progress and encouraged MOB to contact a medical professional if symptoms are noted at any time.  MOB denied any current mental health symptoms and denied any SI or HI. MOB listed primary supports as FOB, FOB's mother, FOB's grandparents, MOB's mother and MOB's step-father.   CSW inquired about if MOB had any substance use during pregnancy. MOB stated she tested positive for benzos back in October. Per MOB, she was experiencing a lot of pain due to a GI virus that she had and that her mom gave her a xanax to help with the pain. MOB denied any other substance use. CSW informed MOB of Hospital Drug Policy and explained UDS and CDS were still pending. MOB expressed understanding of policy and denied any concerns. CSW explained to MOB that due to the fact that 4 of MOB's children are not in her care, that CSW would have to verify with CPS that they did not have any concerns and that there were no barriers to discharge. MOB and FOB expressed understanding.  MOB and FOB confirmed they have all essential items for baby once discharged. MOB stated baby would be sleeping in a basinet once home. CSW provided review of Sudden Infant Death Syndrome (SIDS) precautions and safe sleeping habits. MOB and FOB  receptive to education. MOB and FOB denied any further questions or concerns for CSW at this time.   CSW does not feel there are any safety concerns for infant at this time. CSW spoke with Pam Miller regarding MOB's past CPS involvement. Pam confirmed there were no current open cases involving MOB and reported no concerns or barriers to discharge at this time.   CSW Plan/Description:  Sudden Infant Death Syndrome (SIDS) Education, Perinatal Mood and Anxiety Disorder (PMADs) Education, Hospital Drug Screen Policy Information, CSW Will Continue to Monitor Umbilical Cord Tissue Drug Screen Results and Make Report if Warranted    Quinnton Bury  Irwin, LCSWA 01/19/2019, 1:30 PM 

## 2019-01-19 NOTE — Progress Notes (Signed)
Patient reported a tick is attached to her neck & requesting to get it out. Pulled tick with a forcep, mouthparts visible. Wiped with alcohol pad. Advised to monitor area for changes & marks.

## 2019-01-19 NOTE — Progress Notes (Signed)
Subjective: Postpartum Day #1: Cesarean Delivery Patient reports incisional pain and tolerating PO.    Objective: Vital signs in last 24 hours: Temp:  [97.5 F (36.4 C)-98.5 F (36.9 C)] 98.3 F (36.8 C) (03/31 0438) Pulse Rate:  [73-103] 77 (03/31 0438) Resp:  [10-19] 18 (03/31 0438) BP: (87-128)/(55-93) 116/89 (03/31 0438) SpO2:  [96 %-100 %] 100 % (03/31 0007)  Physical Exam:  General: alert Lochia: appropriate Uterine Fundus: firm Incision: dressing C/D/I   Recent Labs    01/18/19 0820 01/19/19 0500  HGB 11.9* 9.8*  HCT 36.3 30.1*    Assessment/Plan: Status post Cesarean section. Doing well postoperatively.  Continue current care, ambulate, remove foley.  Has not paid hospital for circumcision yet.  Sonya Flores Sonya Flores 01/19/2019, 8:54 AM

## 2019-01-20 ENCOUNTER — Encounter (HOSPITAL_COMMUNITY): Payer: Self-pay | Admitting: *Deleted

## 2019-01-20 MED ORDER — PRENATAL MULTIVITAMIN CH: 1.0000 | ORAL_TABLET | Freq: Every day | ORAL | 3 refills | Status: AC

## 2019-01-20 MED ORDER — OXYCODONE HCL 5 MG PO TABS: 5.0000 mg | ORAL_TABLET | Freq: Four times a day (QID) | ORAL | 0 refills | Status: AC | PRN

## 2019-01-20 MED ORDER — IBUPROFEN 800 MG PO TABS: 800.0000 mg | ORAL_TABLET | Freq: Three times a day (TID) | ORAL | 1 refills | Status: AC | PRN

## 2019-01-20 NOTE — Progress Notes (Signed)
Subjective: Postpartum Day 2: Cesarean Delivery Patient reports incisional pain, tolerating PO and no problems voiding.    Objective: Vital signs in last 24 hours: Temp:  [97.6 F (36.4 C)-98.6 F (37 C)] 98.5 F (36.9 C) (04/01 0624) Pulse Rate:  [71-98] 72 (04/01 0624) Resp:  [16-18] 18 (04/01 0624) BP: (110-121)/(70-78) 121/73 (04/01 0624) SpO2:  [97 %-99 %] 99 % (04/01 4734)  Physical Exam:  General: alert and no distress Lochia: appropriate Uterine Fundus: firm Incision: healing well DVT Evaluation: No evidence of DVT seen on physical exam.  Recent Labs    01/18/19 0820 01/19/19 0500  HGB 11.9* 9.8*  HCT 36.3 30.1*    Assessment/Plan: Status post Cesarean section. Doing well postoperatively.  Continue current care. Desires d/c to home - will d/c when cleared by peds.  circ today.  D/c with motrin, oxycodone and PNV  Sonya Flores 01/20/2019, 7:56 AM

## 2019-01-20 NOTE — Discharge Summary (Signed)
OB Discharge Summary     Patient Name: Sonya Flores DOB: Jan 03, 1986 MRN: [id number]  Date of admission: 01/18/2019 Delivering MD: Huel Cote   Date of discharge: 01/20/2019  Admitting diagnosis: pregnancy Intrauterine pregnancy: [redacted]w[redacted]d     Secondary diagnosis:  Active Problems:   Indication for care in labor and delivery, antepartum   S/P primary low transverse C-section  Additional problems: N/A     Discharge diagnosis: Term Pregnancy Delivered                                                                                                Post partum procedures:N/A  Augmentation: AROM and Pitocin  Complications: None  Hospital course:  Induction of Labor With Cesarean Section  33 y.o. yo [id number] at [redacted]w[redacted]d was admitted to the hospital 01/18/2019 for induction of labor. Patient had a labor course significant for variable decels. The patient went for cesarean section due to repetitive variable decels, and delivered a Viable infant,01/18/2019  Membrane Rupture Time/Date: 9:30 AM ,01/18/2019   Details of operation can be found in separate operative Note.  Patient had an uncomplicated postpartum course. She is ambulating, tolerating a regular diet, passing flatus, and urinating well.  Patient is discharged home in stable condition on 01/20/19.                                    Physical exam  Vitals:   01/19/19 1311 01/19/19 1625 01/19/19 2123 01/20/19 0624  BP: 110/70 115/78 117/74 121/73  Pulse: 75 82 98 72  Resp: 16 17 18 18   Temp: 98.4 F (36.9 C) 97.6 F (36.4 C) 97.9 F (36.6 C) 98.5 F (36.9 C)  TempSrc: Oral Oral Oral Oral  SpO2: 98%  97% 99%  Weight:      Height:       General: alert and no distress Lochia: appropriate Uterine Fundus: firm Incision: Healing well with no significant drainage DVT Evaluation: No evidence of DVT seen on physical exam. Labs: Lab Results  Component Value Date   WBC 13.1 (H) 01/19/2019   HGB 9.8 (L) 01/19/2019   HCT 30.1 (L)  01/19/2019   MCV 92.9 01/19/2019   PLT 228 01/19/2019   CMP Latest Ref Rng & Units 12/24/2010  Glucose 70 - 99 mg/dL 703(J)  BUN 6 - 23 mg/dL 6  Creatinine 0.4 - 1.2 mg/dL 0.09  Sodium 381 - 829 mEq/L 133(L)  Potassium 3.5 - 5.1 mEq/L 3.5  Chloride 96 - 112 mEq/L 103  CO2 19 - 32 mEq/L 22  Calcium 8.4 - 10.5 mg/dL 8.7  Total Protein 6.0 - 8.3 g/dL 6.5  Total Bilirubin 0.3 - 1.2 mg/dL 0.6  Alkaline Phos 39 - 117 U/L 153(H)  AST 0 - 37 U/L 27  ALT 0 - 35 U/L 29    Discharge instruction: per After Visit Summary and "Baby and Me Booklet".  After visit meds:  Allergies as of 01/20/2019      Reactions   Coconut Flavor Swelling   Throat swells  Medication List    TAKE these medications   calcium carbonate 500 MG chewable tablet Commonly known as:  TUMS - dosed in mg elemental calcium Chew 1 tablet by mouth as needed for indigestion or heartburn.   ibuprofen 800 MG tablet Commonly known as:  ADVIL,MOTRIN Take 1 tablet (800 mg total) by mouth every 8 (eight) hours as needed.   oxyCODONE 5 MG immediate release tablet Commonly known as:  Oxy IR/ROXICODONE Take 1-2 tablets (5-10 mg total) by mouth every 6 (six) hours as needed for moderate pain.   prenatal multivitamin Tabs tablet Take 1 tablet by mouth daily at 12 noon.       Diet: routine diet  Activity: Advance as tolerated. Pelvic rest for 6 weeks.   Outpatient follow up:2 and 6 weeks Follow up Appt:No future appointments. Follow up Visit:No follow-ups on file.  Postpartum contraception: Undecided  Newborn Data: Live born female  Birth Weight: 7 lb 14.6 oz (3590 g) APGAR: 8, 9  Newborn Delivery   Birth date/time:  01/18/2019 14:44:00 Delivery type:  C-Section, Low Transverse C-section categorization:  Primary     Baby Feeding: Breast Disposition:home with mother   01/20/2019 Sherian Rein, MD

## 2019-01-20 NOTE — Lactation Note (Signed)
This note was copied from a baby's chart. Lactation Consultation Note: Mother reports that infant is feeding well. She reports that she needs to toughen up her nipples some that the are still hurting some. Mother advised to get infant latched on with wide open latch and hug her cluster to her body. Reviewed cross cradle and football and using good support.  Encouraged to get infant to open her mother wide with lips flanged widely.  Mother to continue to cue base feed and to breastfeed 8-12 time sin 24 hours or more. Mother encouraged to folllow up with [facility] services as needed for questions or concerns.  Mother receptive to all teaching.    Patient Name: Sonya Flores [id number] Date: 01/20/2019 Reason for consult: Follow-up assessment   Maternal Data    Feeding    LATCH Score                   Interventions Interventions: Skin to skin;Hand express;Pre-pump if needed;Expressed milk;Hand pump  Lactation Tools Discussed/Used     Consult Status Consult Status: Complete    Michel Bickers 01/20/2019, 10:14 AM

## 2019-02-10 ENCOUNTER — Encounter (HOSPITAL_COMMUNITY): Payer: Self-pay | Admitting: Obstetrics and Gynecology

## 2019-02-10 NOTE — Addendum Note (Signed)
Addendum  created 02/10/19 1103 by Jhonnie Garner, CRNA   Intraprocedure Event edited

## 2019-03-17 ENCOUNTER — Telehealth: Payer: Self-pay | Admitting: Pharmacy Technician

## 2019-03-17 ENCOUNTER — Encounter: Payer: Medicaid Other | Admitting: Internal Medicine

## 2019-03-17 NOTE — Telephone Encounter (Signed)
RCID Patient Product/process development scientist completed.    The patient is insured through [facility] and has a 00 copay.   Sonya Flores. Dimas Aguas CPhT Specialty Pharmacy Patient [facility] for Infectious Disease Phone: [phone] Fax:  [phone]

## 2019-05-24 ENCOUNTER — Encounter: Payer: Medicaid Other | Admitting: Internal Medicine

## 2019-06-02 ENCOUNTER — Encounter: Payer: Medicaid Other | Admitting: Internal Medicine

## 2019-09-06 ENCOUNTER — Telehealth: Payer: Self-pay

## 2019-09-06 ENCOUNTER — Telehealth: Payer: Self-pay | Admitting: Pharmacy Technician

## 2019-09-06 NOTE — Telephone Encounter (Signed)
RCID Patient Teacher, English as a foreign language completed.    The patient is insured through [facility] and has a $3 copay.  A readiness to treat form will need to be signed for prior authorization approval.   Inez Catalina E. Nadara Mustard [place] Patient [facility] for Infectious Disease Phone: [phone] Fax:  [phone]

## 2019-09-06 NOTE — Telephone Encounter (Signed)
COVID-19 Pre-Screening Questions:09/06/19   Do you currently have a fever (>100 F), chills or unexplained body aches? NO  Are you currently experiencing new cough, shortness of breath, sore throat, runny nose? NO .  Have you recently travelled outside the state of [place] in the last 14 days? NO .  Have you been in contact with someone that is currently pending confirmation of Covid19 testing or has been confirmed to have the Covid19 virus?  NO   **If the patient answers NO to ALL questions -  advise the patient to please call the clinic before coming to the office should any symptoms develop.     

## 2019-09-07 ENCOUNTER — Ambulatory Visit (INDEPENDENT_AMBULATORY_CARE_PROVIDER_SITE_OTHER): Payer: Medicaid Other | Admitting: Internal Medicine

## 2019-09-07 ENCOUNTER — Encounter: Payer: Self-pay | Admitting: Internal Medicine

## 2019-09-07 ENCOUNTER — Other Ambulatory Visit: Payer: Self-pay

## 2019-09-07 ENCOUNTER — Telehealth: Payer: Self-pay | Admitting: Pharmacy Technician

## 2019-09-07 DIAGNOSIS — B182 Chronic viral hepatitis C: Secondary | ICD-10-CM | POA: Diagnosis present

## 2019-09-07 MED ORDER — MAVYRET 100-40 MG PO TABS: 3.0000 | ORAL_TABLET | Freq: Every day | ORAL | 1 refills | Status: AC

## 2019-09-07 NOTE — Telephone Encounter (Signed)
RCID SPECIALTY PATIENT ADVOCATE ENCOUNTER  Got the needed Readiness to treat form signed from patient to file a prior authorization for Hepatitis C medication through [facility] Medicaid.  Sonya Flores. [name] Patient [facility] for Infectious Disease Phone: [phone] Fax:  [phone]

## 2019-09-07 NOTE — Patient Instructions (Signed)
Date 09/07/19  Dear Ms Eichholz, As discussed in the [facility], your hepatitis C therapy will include the following medications:          Mavyret (glecaprevir 100 mg/pibrentasvir 40 mg): Take 3 tablets by mouth once daily for 8 or 12 weeks ---------------------------------------------------------------- Your HCV Treatment Start Date: TBA   Your HCV genotype: 3    Liver Fibrosis: TBD    ---------------------------------------------------------------- YOUR PHARMACY CONTACT:   [facility] [address] [place], [place] [address] Phone: [phone] Hours: Monday to Friday 7:30 am to 6:00 pm   Please always contact your pharmacy at least 3-4 business days before you run out of medications to ensure your next month's medication is ready or 1 week prior to running out if you receive it by mail.  Remember, each prescription is for 28 days. ---------------------------------------------------------------- GENERAL NOTES REGARDING YOUR HEPATITIS C MEDICATION:  Mavyret: - tablets are pink, oblong shape - take 3 tablets daily with food. - The tablets should be stored at room temperature.  - Acid reducing agents such as H2 blockers (ie. Pepcid (famotidine), Zantac (ranitidine), Tagamet (cimetidine), Axid (nizatidine) and proton pump inhibitors (ie. Prilosec (omeprazole), Protonix (pantoprazole), Nexium (esomeprazole), or Aciphex (rabeprazole)) can decrease effectiveness of Harvoni. Do not take until you have discussed with a health care provider.    -Antacids that contain magnesium and/or aluminum hydroxide (ie. Milk of Magensia, Rolaids, Gaviscon, Maalox, Mylanta, an dArthritis Pain Formula) can reduce absorption of Harvoni, so take them at least 4 hours before or after Harvoni.  -Calcium carbonate (calcium supplements or antacids such as Tums, Caltrate, Os-Cal) needs to be taken at least 4 hours hours before or after Harvoni.  -St. John's wort or any products that contain St.  John's wort like some herbal supplements  Please inform the office prior to starting any of these medications.  - The common side effects associated with Mavyret include:      1. Fatigue      2. Headache      3. Nausea      4. Diarrhea      5. Insomnia  Please note that this only lists the most common side effects and is NOT a comprehensive list of the potential side effects of these medications. For more information, please review the drug information sheets that come with your medication package from the pharmacy.  ---------------------------------------------------------------- GENERAL HELPFUL HINTS ON HCV THERAPY: 1. Stay well-hydrated. 2. Notify the ID Clinic of any changes in your other over-the-counter/herbal or prescription medications. 3. If you miss a dose of your medication, take the missed dose as soon as you remember. Return to your regular time/dose schedule the next day.  4.  Do not stop taking your medications without first talking with your healthcare provider. 5.  You may take Tylenol (acetaminophen), as long as the dose is less than 2000 mg (OR no more than 4 tablets of the Tylenol Extra Strengths 500mg  tablet) in 24 hours. 6.  You will see our pharmacist-specialist within the first 2 weeks of starting your medication to monitor for any possible side effects. 7.  You will have labs once during treatment, after soon after treatment completion and one final lab 6 months after treatment completion to verify the virus is out of your system.  Thayer Headings, [place] for Infectious Diseases [facility] [place], [place]  [id number] [phone]

## 2019-09-07 NOTE — Progress Notes (Signed)
Patient ID: Sonya Flores, female   DOB: 13-Feb-1986, 33 y.o.   MRN: [id number]     [facility] for Infectious Disease   CC: consideration for treatment for chronic hepatitis C  HPI:  +Sonya Flores is a 33 y.o. female who presents for initial evaluation and management of chronic hepatitis C.  Patient tested positive earlier this year during her pregnancy. Hepatitis C-associated risk factors present are: IV drug abuse (details: > 10 years ago). Patient denies renal dialysis. Patient has had other studies performed. Results: hepatitis C RNA by PCR, result: positive. Patient has not had prior treatment for Hepatitis C. Patient does not have a past history of liver disease. Patient does not have a family history of liver disease. Patient does not  have associated signs or symptoms related to liver disease.  Labs reviewed and confirm chronic hepatitis C with a positive viral load.   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       Patient does not have documented immunity to Hepatitis A. Patient does not have documented immunity to Hepatitis B.    Review of Systems:  Constitutional: negative for fatigue, malaise and anorexia Gastrointestinal: negative for nausea and diarrhea Integument/breast: negative for rash Hematologic/lymphatic: negative for lymphadenopathy Musculoskeletal: negative for myalgias and arthralgias All other systems reviewed and are negative       Past Medical History:  Diagnosis Date  . Anxiety   . Depression   . History of gestational hypertension   . History of shingles   . Kidney stone     Prior to Admission medications   Medication Sig Start Date End Date Taking? Authorizing Provider  Prenatal Vit-Fe Fumarate-FA (PRENATAL MULTIVITAMIN) TABS tablet Take 1 tablet by mouth daily at 12 noon. 01/20/19  Yes Bovard-Stuckert, Jody, MD  calcium carbonate (TUMS - DOSED IN  MG ELEMENTAL CALCIUM) 500 MG chewable tablet Chew 1 tablet by mouth as needed for indigestion or heartburn.    [provider]  ibuprofen (ADVIL,MOTRIN) 800 MG tablet Take 1 tablet (800 mg total) by mouth every 8 (eight) hours as needed. Patient not taking: Reported on 09/07/2019 01/20/19   Bovard-Stuckert, Jeral Fruit, MD  oxyCODONE (OXY IR/ROXICODONE) 5 MG immediate release tablet Take 1-2 tablets (5-10 mg total) by mouth every 6 (six) hours as needed for moderate pain. Patient not taking: Reported on 09/07/2019 01/20/19   Janyth Contes, MD    Allergies  Allergen Reactions  . Coconut Flavor Swelling    Throat swells    Social History   Tobacco Use  . Smoking status: Former Smoker    Packs/day: 0.25    Quit date: 02/12/2014    Years since quitting: 5.5  . Smokeless tobacco: Never Used  Substance Use Topics  . Alcohol use: Not Currently    Comment: hx alcoholism no alcohol in 4-5 years  . Drug use: No    Family History  Problem Relation Age of Onset  . Osteoarthritis Mother   . Cancer Mother        melanoma  . Hyperlipidemia Mother   . Hypertension Mother   . Osteoarthritis Maternal Grandfather   . Cancer Maternal Grandfather        lung and liver cancer  . Heart failure Maternal Grandfather   . Hypertension Maternal Grandfather       Objective:  Constitutional: in no apparent distress, There were no vitals filed  for this visit. Eyes: anicteric Cardiovascular: Cor RRR Respiratory: CTA B; normal resspiratory effort Gastrointestinal: Bowel sounds are normal, liver is not enlarged, spleen is not enlarged Musculoskeletal: no pedal edema noted Skin: negatives: no rash; no porphyria cutanea tarda Lymphatic: no cervical lymphadenopathy   Laboratory Genotype: No results found for: HCVGENOTYPE HCV viral load: No results found for: HCVQUANT Lab Results  Component Value Date   WBC 13.1 (H) 01/19/2019   HGB 9.8 (L) 01/19/2019   HCT 30.1 (L) 01/19/2019   MCV 92.9  01/19/2019   PLT 228 01/19/2019    Lab Results  Component Value Date   CREATININE 0.60 12/24/2010   BUN 6 12/24/2010   NA 133 (L) 12/24/2010   K 3.5 12/24/2010   CL 103 12/24/2010   CO2 22 12/24/2010    Lab Results  Component Value Date   ALT 29 12/24/2010   AST 27 12/24/2010   ALKPHOS 153 (H) 12/24/2010     Labs and history reviewed and show CHILD-PUGH unknown  5-6 points: Child class A 7-9 points: Child class B 10-15 points: Child class C  Lab Results  Component Value Date   BILITOT 0.6 12/24/2010   ALBUMIN 2.6 (L) 12/24/2010     Assessment: New Patient with Chronic Hepatitis C genotype unknown, untreated.  I discussed with the patient the lab findings that confirm chronic hepatitis C as well as the natural history and progression of disease including about 30% of people who develop cirrhosis of the liver if left untreated and once cirrhosis is established there is a 2-7% risk per year of liver cancer and liver failure.  I discussed the importance of treatment and benefits in reducing the risk, even if significant liver fibrosis exists.   Plan: 1) Patient counseled extensively on limiting acetaminophen to no more than 2 grams daily, avoidance of alcohol. 2) Transmission discussed with patient including sexual transmission, sharing razors and toothbrush.   3) Will need referral to gastroenterology if concern for cirrhosis 4) Will need referral for substance abuse counseling: No.; Further work up to include urine drug screen  No. 5) Will prescribe appropriate medication based on genotype and coverage  6) Hepatitis A and B titers 7) Pneumovax vaccine if not previously given - will give next visit 9) Further work up to include liver staging with elastography 10) will follow up after starting medication  I will have her follow up with me 3-4 weeks after starting medication

## 2019-09-10 LAB — LIVER FIBROSIS, FIBROTEST-ACTITEST
ALT: 40 U/L — ABNORMAL HIGH (ref 6–29)
Alpha-2-Macroglobulin: 186 mg/dL (ref 106–279)
Apolipoprotein A1: 171 mg/dL (ref 101–198)
Bilirubin: 0.4 mg/dL (ref 0.2–1.2)
Fibrosis Score: 0.04
GGT: 11 U/L (ref 3–50)
Haptoglobin: 171 mg/dL (ref 43–212)
Necroinflammat ACT Score: 0.16
Reference ID: 3167175

## 2019-09-10 LAB — HEPATITIS B SURFACE ANTIGEN: Hepatitis B Surface Ag: NONREACTIVE

## 2019-09-10 LAB — CBC WITH DIFFERENTIAL/PLATELET
Absolute Monocytes: 281 cells/uL (ref 200–950)
Basophils Absolute: 22 cells/uL (ref 0–200)
Basophils Relative: 0.4 %
Eosinophils Absolute: 70 cells/uL (ref 15–500)
Eosinophils Relative: 1.3 %
HCT: 43.3 % (ref 35.0–45.0)
Hemoglobin: 14.6 g/dL (ref 11.7–15.5)
Lymphs Abs: 1885 cells/uL (ref 850–3900)
MCH: 30.9 pg (ref 27.0–33.0)
MCHC: 33.7 g/dL (ref 32.0–36.0)
MCV: 91.5 fL (ref 80.0–100.0)
MPV: 12.7 fL — ABNORMAL HIGH (ref 7.5–12.5)
Monocytes Relative: 5.2 %
Neutro Abs: 3143 cells/uL (ref 1500–7800)
Neutrophils Relative %: 58.2 %
Platelets: 218 10*3/uL (ref 140–400)
RBC: 4.73 10*6/uL (ref 3.80–5.10)
RDW: 12.1 % (ref 11.0–15.0)
Total Lymphocyte: 34.9 %
WBC: 5.4 10*3/uL (ref 3.8–10.8)

## 2019-09-10 LAB — HIV ANTIBODY (ROUTINE TESTING W REFLEX): HIV 1&2 Ab, 4th Generation: NONREACTIVE

## 2019-09-10 LAB — COMPLETE METABOLIC PANEL WITH GFR
AG Ratio: 1.4 (calc) (ref 1.0–2.5)
ALT: 41 U/L — ABNORMAL HIGH (ref 6–29)
AST: 34 U/L — ABNORMAL HIGH (ref 10–30)
Albumin: 4.7 g/dL (ref 3.6–5.1)
Alkaline phosphatase (APISO): 45 U/L (ref 31–125)
BUN: 13 mg/dL (ref 7–25)
CO2: 20 mmol/L (ref 20–32)
Calcium: 10 mg/dL (ref 8.6–10.2)
Chloride: 106 mmol/L (ref 98–110)
Creat: 0.82 mg/dL (ref 0.50–1.10)
GFR, Est African American: 109 mL/min/{1.73_m2} (ref >=60)
GFR, Est Non African American: 94 mL/min/{1.73_m2} (ref >=60)
Globulin: 3.3 g/dL (calc) (ref 1.9–3.7)
Glucose, Bld: 90 mg/dL (ref 65–99)
Potassium: 4 mmol/L (ref 3.5–5.3)
Sodium: 139 mmol/L (ref 135–146)
Total Bilirubin: 0.4 mg/dL (ref 0.2–1.2)
Total Protein: 8 g/dL (ref 6.1–8.1)

## 2019-09-10 LAB — PROTIME-INR
INR: 1
Prothrombin Time: 10.3 s (ref 9.0–11.5)

## 2019-09-10 LAB — HEPATITIS B SURFACE ANTIBODY,QUALITATIVE: Hep B S Ab: REACTIVE — AB

## 2019-09-10 LAB — HEPATITIS B CORE ANTIBODY, TOTAL: Hep B Core Total Ab: NONREACTIVE

## 2019-09-10 LAB — HEPATITIS A ANTIBODY, TOTAL: Hepatitis A AB,Total: NONREACTIVE

## 2019-09-13 ENCOUNTER — Other Ambulatory Visit: Payer: Self-pay | Admitting: Internal Medicine

## 2019-09-13 ENCOUNTER — Other Ambulatory Visit: Payer: Self-pay

## 2019-09-13 ENCOUNTER — Ambulatory Visit (HOSPITAL_COMMUNITY)
Admission: RE | Admit: 2019-09-13 | Discharge: 2019-09-13 | Disposition: A | Discharge status: Home / Self Care | Payer: Medicaid Other | Source: Ambulatory Visit | Attending: Internal Medicine | Admitting: Internal Medicine

## 2019-09-13 DIAGNOSIS — B182 Chronic viral hepatitis C: Secondary | ICD-10-CM | POA: Insufficient documentation

## 2019-10-04 ENCOUNTER — Telehealth: Payer: Self-pay | Admitting: Pharmacy Technician

## 2019-10-04 ENCOUNTER — Other Ambulatory Visit: Payer: Self-pay | Admitting: Pharmacist

## 2019-10-04 DIAGNOSIS — B182 Chronic viral hepatitis C: Secondary | ICD-10-CM

## 2019-10-04 NOTE — Progress Notes (Addendum)
Hep C labs 

## 2019-10-04 NOTE — Telephone Encounter (Signed)
RCID Patient Advocate Encounter   Received notification from [place] that prior authorization for [place] is required.   PA submitted on 10/04/2019 Key [id number] Viona Gilmore PA# [id number] Status is pending    [facility] will continue to follow.  Bartholomew Crews, CPhT Specialty Pharmacy Patient [facility] for Infectious Disease Phone: [phone] Fax: [phone] 10/04/2019 10:25 AM

## 2019-10-04 NOTE — Telephone Encounter (Addendum)
RCID Patient Advocate Encounter  Received notification from [place] that the request for prior authorization for Mavyret has been denied due to the HCV RNA being more than 6 months old (last taken 01/30/2019).     This determination is currently in pending status needing the patient to come back in for new labs. She will come Friday December 18 to get labs  Will resubmit the prior authorization once we have new lab results. Patient came for labs 12/18. Will resubmit to [place] tracks once newer labs return.   Bartholomew Crews, CPhT Specialty Pharmacy Patient [facility] for Infectious Disease Phone: [phone] Fax: [phone] 10/04/2019 3:16 PM

## 2019-10-07 ENCOUNTER — Telehealth: Payer: Self-pay

## 2019-10-07 NOTE — Telephone Encounter (Signed)
COVID-19 Pre-Screening Questions:  Do you currently have a fever (>100 F), chills or unexplained body aches? NO   Are you currently experiencing new cough, shortness of breath, sore throat, runny nose?NO .  Have you recently travelled outside the state of [place] in the last 14 days? NO .  Have you been in contact with someone that is currently pending confirmation of Covid19 testing or has been confirmed to have the Covid19 virus?  NO  **If the patient answers NO to ALL questions -  advise the patient to please call the clinic before coming to the office should any symptoms develop.     

## 2019-10-08 ENCOUNTER — Other Ambulatory Visit: Payer: Self-pay

## 2019-10-08 ENCOUNTER — Other Ambulatory Visit: Payer: Medicaid Other

## 2019-10-08 DIAGNOSIS — B182 Chronic viral hepatitis C: Secondary | ICD-10-CM

## 2019-10-17 LAB — HEPATITIS C RNA QUANTITATIVE
HCV Quantitative Log: 6.85 Log IU/mL — ABNORMAL HIGH
HCV RNA, PCR, QN: 7140000 IU/mL — ABNORMAL HIGH

## 2019-10-17 LAB — HEPATITIS C GENOTYPE: HCV Genotype: 3

## 2019-10-18 ENCOUNTER — Telehealth: Payer: Self-pay | Admitting: Pharmacist

## 2019-10-18 ENCOUNTER — Encounter: Payer: Self-pay | Admitting: Pharmacy Technician

## 2019-10-18 MED FILL — MAVYRET 100-40 MG TABS: 100-40 | 28 days supply | Qty: 84 | Fill #0

## 2019-10-18 NOTE — Telephone Encounter (Addendum)
RCID Patient Advocate Encounter   Received notification from [place] that prior authorization for [place] is required.   PA submitted on 10/18/2019 Key [id number] W Status is approved, [place] [employer] 10/18/2019-12/17/2019    [facility] Clinic will continue to follow. Resubmit now that updated labs have returned. Will call patient and set up first shipment and follow up appointment.  Bartholomew Crews, CPhT Specialty Pharmacy Patient [facility] for Infectious Disease Phone: [phone] Fax: [phone] 10/18/2019 9:26 AM

## 2019-10-19 NOTE — Telephone Encounter (Addendum)
Thanks Tyler! 

## 2019-11-02 ENCOUNTER — Other Ambulatory Visit: Payer: Self-pay | Admitting: Pharmacist

## 2019-11-02 DIAGNOSIS — B182 Chronic viral hepatitis C: Secondary | ICD-10-CM

## 2019-11-10 MED FILL — MAVYRET 100-40 MG TABS: 100-40 | 28 days supply | Qty: 84 | Fill #1

## 2019-11-19 ENCOUNTER — Ambulatory Visit: Payer: Medicaid Other | Admitting: Pharmacist

## 2019-11-25 ENCOUNTER — Telehealth: Payer: Self-pay

## 2019-11-25 NOTE — Telephone Encounter (Signed)
COVID-19 Pre-Screening Questions:11/25/19  Do you currently have a fever (>100 F), chills or unexplained body aches? NO   Are you currently experiencing new cough, shortness of breath, sore throat, runny nose? NO  .  Have you recently travelled outside the state of [place] in the last 14 days? NO  .  Have you been in contact with someone that is currently pending confirmation of Covid19 testing or has been confirmed to have the Covid19 virus?NO  **If the patient answers NO to ALL questions -  advise the patient to please call the clinic before coming to the office should any symptoms develop.     

## 2019-11-26 ENCOUNTER — Other Ambulatory Visit: Payer: Medicaid Other

## 2019-11-26 ENCOUNTER — Other Ambulatory Visit: Payer: Self-pay

## 2019-11-26 ENCOUNTER — Ambulatory Visit: Payer: Medicaid Other | Admitting: Pharmacist

## 2019-11-26 DIAGNOSIS — B182 Chronic viral hepatitis C: Secondary | ICD-10-CM

## 2019-11-28 LAB — COMPREHENSIVE METABOLIC PANEL
AG Ratio: 1.6 (calc) (ref 1.0–2.5)
ALT: 16 U/L (ref 6–29)
AST: 20 U/L (ref 10–30)
Albumin: 4.2 g/dL (ref 3.6–5.1)
Alkaline phosphatase (APISO): 49 U/L (ref 31–125)
BUN: 11 mg/dL (ref 7–25)
CO2: 25 mmol/L (ref 20–32)
Calcium: 9.5 mg/dL (ref 8.6–10.2)
Chloride: 107 mmol/L (ref 98–110)
Creat: 0.84 mg/dL (ref 0.50–1.10)
Globulin: 2.7 g/dL (calc) (ref 1.9–3.7)
Glucose, Bld: 96 mg/dL (ref 65–99)
Potassium: 4.2 mmol/L (ref 3.5–5.3)
Sodium: 138 mmol/L (ref 135–146)
Total Bilirubin: 0.4 mg/dL (ref 0.2–1.2)
Total Protein: 6.9 g/dL (ref 6.1–8.1)

## 2019-11-28 LAB — HEPATITIS C RNA QUANTITATIVE
HCV Quantitative Log: <1.18 Log IU/mL — AB
HCV RNA, PCR, QN: <15 IU/mL — AB

## 2019-12-02 ENCOUNTER — Telehealth: Payer: Self-pay

## 2019-12-02 NOTE — Telephone Encounter (Signed)
Patient states she is expecting a call from provider with most recent lab results. Noted patient also does not have f/u appointment. Routing to provider to review recent labs. Sonya Flores

## 2019-12-03 NOTE — Telephone Encounter (Signed)
Looks like her follow up visit with Cassie was cancelled.  She can reschedule that with a virtual visit to go over the labs. thanks

## 2019-12-03 NOTE — Telephone Encounter (Signed)
FYI Evisit scheduled for Monday

## 2019-12-03 NOTE — Telephone Encounter (Signed)
Thank Wallie Char

## 2019-12-08 ENCOUNTER — Ambulatory Visit (INDEPENDENT_AMBULATORY_CARE_PROVIDER_SITE_OTHER): Payer: Medicaid Other | Admitting: Pharmacist

## 2019-12-08 ENCOUNTER — Other Ambulatory Visit: Payer: Self-pay

## 2019-12-08 DIAGNOSIS — B182 Chronic viral hepatitis C: Secondary | ICD-10-CM

## 2019-12-08 NOTE — Progress Notes (Signed)
Virtual Visit via Telephone Note  I connected with Sonya Flores on 12/08/2019 at 10:00 AM EST by telephone and verified that I am speaking with the correct person using two identifiers.  Location: Patient: Home Provider: Office   I discussed the limitations, risks, security and privacy concerns of performing an evaluation and management service by telephone and the availability of in person appointments. I also discussed with the patient that there may be a patient responsible charge related to this service. The patient expressed understanding and agreed to proceed.    HPI: Sonya Flores is a 34 y.o. female who presents to the [place] clinic for Hepatitis C follow-up.  Medication: Mavyret x 8 weeks  Start Date: 10/18/19  Hepatitis C Genotype: 3  Fibrosis Score: F0  Hepatitis C RNA: 7.1 million on 10/08/19; <15 on 11/26/2019  Patient Active Problem List   Diagnosis Date Noted  . Chronic hepatitis C without hepatic coma ([place]) 09/07/2019  . Indication for care in labor and delivery, antepartum 01/18/2019  . S/P primary low transverse C-section 01/18/2019    Patient's Medications  New Prescriptions   No medications on file  Previous Medications   CALCIUM CARBONATE (TUMS - DOSED IN MG ELEMENTAL CALCIUM) 500 MG CHEWABLE TABLET    Chew 1 tablet by mouth as needed for indigestion or heartburn.   GLECAPREVIR-PIBRENTASVIR (MAVYRET) 100-40 MG TABS    Take 3 tablets by mouth daily.   IBUPROFEN (ADVIL,MOTRIN) 800 MG TABLET    Take 1 tablet (800 mg total) by mouth every 8 (eight) hours as needed.   OXYCODONE (OXY IR/ROXICODONE) 5 MG IMMEDIATE RELEASE TABLET    Take 1-2 tablets (5-10 mg total) by mouth every 6 (six) hours as needed for moderate pain.   PRENATAL VIT-FE FUMARATE-FA (PRENATAL MULTIVITAMIN) TABS TABLET    Take 1 tablet by mouth daily at 12 noon.  Modified Medications   No medications on file  Discontinued Medications   No medications on file     Allergies: Allergies  Allergen Reactions  . Coconut Flavor Swelling    Throat swells    Past Medical History: Past Medical History:  Diagnosis Date  . Anxiety   . Depression   . History of gestational hypertension   . History of shingles   . Kidney stone     Social History: Social History   Socioeconomic History  . Marital status: Single    Spouse name: Not on file  . Number of children: Not on file  . Years of education: Not on file  . Highest education level: Not on file  Occupational History  . Not on file  Tobacco Use  . Smoking status: Former Smoker    Packs/day: 0.25    Quit date: 02/12/2014    Years since quitting: 5.8  . Smokeless tobacco: Never Used  Substance and Sexual Activity  . Alcohol use: Not Currently    Comment: hx alcoholism no alcohol in 4-5 years  . Drug use: No  . Sexual activity: Yes  Other Topics Concern  . Not on file  Social History Narrative  . Not on file   Social Determinants of Health   Financial Resource Strain: Low Risk   . Difficulty of Paying Living Expenses: Not hard at all  Food Insecurity: No Food Insecurity  . Worried About Charity fundraiser in the Last Year: Never true  . Ran Out of Food in the Last Year: Never true  Transportation Needs: Unknown  . Lack of  Transportation (Medical): No  . Lack of Transportation (Non-Medical): Not on file  Physical Activity:   . Days of Exercise per Week: Not on file  . Minutes of Exercise per Session: Not on file  Stress: No Stress Concern Present  . Feeling of Stress : Not at all  Social Connections:   . Frequency of Communication with Friends and Family: Not on file  . Frequency of Social Gatherings with Friends and Family: Not on file  . Attends Religious Services: Not on file  . Active Member of Clubs or Organizations: Not on file  . Attends Banker Meetings: Not on file  . Marital Status: Not on file    Labs: Hepatitis C Lab Results  Component Value  Date   HCVGENOTYPE 3 10/08/2019   HCVRNAPCRQN <15 DETECTED (A) 11/26/2019   HCVRNAPCRQN 7,140,000 (H) 10/08/2019   FIBROSTAGE F0 09/07/2019   Hepatitis B Lab Results  Component Value Date   HEPBSAB REACTIVE (A) 09/07/2019   HEPBSAG NON-REACTIVE 09/07/2019   HEPBCAB NON-REACTIVE 09/07/2019   Hepatitis A Lab Results  Component Value Date   HAV NON-REACTIVE 09/07/2019   HIV Lab Results  Component Value Date   HIV NON-REACTIVE 09/07/2019   HIV Non-reactive 07/15/2018   HIV Non-reactive 05/09/2014   HIV NON REACTIVE 12/24/2010   Lab Results  Component Value Date   CREATININE 0.84 11/26/2019   CREATININE 0.82 09/07/2019   CREATININE 0.60 12/24/2010   CREATININE 0.45 11/13/2010   CREATININE 0.71 05/04/2010   Lab Results  Component Value Date   AST 20 11/26/2019   AST 34 (H) 09/07/2019   AST 27 12/24/2010   ALT 16 11/26/2019   ALT 41 (H) 09/07/2019   ALT 40 (H) 09/07/2019   INR 1.0 09/07/2019    Assessment: I spoke to Sonya Flores on the phone today to follow up on her Hep C treatment.  She started 8 weeks of Mavyret on 10/18/2019 and came last week to get labs drawn.  She is doing very well on the medication and reports that she takes all three tablets together with food every day.  She did miss one dose during the first week of treatment but no other missed doses since.    She is having some fatigue and headaches.  Her fatigue was affecting her work so she started taking it at night and it seems to be helping. No other issues.  She has about 2.5 weeks left of treatment.  Her viral load on 2/5 was undetectable.  Explained this to her and congratulated her on her adherence and encouraged continued compliance for the last 2-3 weeks she is on therapy. Encouraged her not to miss anymore doses. Will have her f/u again when she has completed therapy.   Plan: - Finish Mavyret x 8 weeks - F/u for EOT visit 3/8 at 915am   I discussed the assessment and treatment plan with the  patient. The patient was provided an opportunity to ask questions and all were answered. The patient agreed with the plan and demonstrated an understanding of the instructions.   The patient was advised to call back or seek an in-person evaluation if the symptoms worsen or if the condition fails to improve as anticipated.  I provided 10 minutes of non-face-to-face time during this encounter.   Keilyn Haggard L. Besse Miron, PharmD, BCIDP, AAHIVP, CPP Clinical Pharmacist Practitioner Infectious Diseases Clinical Pharmacist [facility] 12/09/2019, 10:13 AM

## 2019-12-24 ENCOUNTER — Telehealth: Payer: Self-pay

## 2019-12-24 NOTE — Telephone Encounter (Signed)
COVID-19 Pre-Screening Questions:12/24/19  Do you currently have a fever (>100 F), chills or unexplained body aches?NO  Are you currently experiencing new cough, shortness of breath, sore throat, runny nose? NO  .  Have you recently travelled outside the state of  in the last 14 days?NO .  Have you been in contact with someone that is currently pending confirmation of Covid19 testing or has been confirmed to have the Covid19 virus? NO  **If the patient answers NO to ALL questions -  advise the patient to please call the clinic before coming to the office should any symptoms develop.     

## 2019-12-27 ENCOUNTER — Ambulatory Visit (INDEPENDENT_AMBULATORY_CARE_PROVIDER_SITE_OTHER): Payer: Medicaid Other | Admitting: Pharmacist

## 2019-12-27 ENCOUNTER — Other Ambulatory Visit: Payer: Self-pay

## 2019-12-27 DIAGNOSIS — B182 Chronic viral hepatitis C: Secondary | ICD-10-CM

## 2019-12-27 NOTE — Progress Notes (Signed)
HPI: Sonya Flores is a 34 y.o. female who presents to the [facility] pharmacy clinic for Hepatitis C follow-up.  Medication: Mavyret x 8 weeks  Start Date: 10/18/19  Hepatitis C Genotype: 3  Fibrosis Score: F0  Hepatitis C RNA: 7.1 million on 10/08/19; <15 on 11/26/2019  Patient Active Problem List   Diagnosis Date Noted  . Chronic hepatitis C without hepatic coma (HCC) 09/07/2019  . Indication for care in labor and delivery, antepartum 01/18/2019  . S/P primary low transverse C-section 01/18/2019    Patient's Medications  New Prescriptions   No medications on file  Previous Medications   CALCIUM CARBONATE (TUMS - DOSED IN MG ELEMENTAL CALCIUM) 500 MG CHEWABLE TABLET    Chew 1 tablet by mouth as needed for indigestion or heartburn.   GLECAPREVIR-PIBRENTASVIR (MAVYRET) 100-40 MG TABS    Take 3 tablets by mouth daily.   IBUPROFEN (ADVIL,MOTRIN) 800 MG TABLET    Take 1 tablet (800 mg total) by mouth every 8 (eight) hours as needed.   OXYCODONE (OXY IR/ROXICODONE) 5 MG IMMEDIATE RELEASE TABLET    Take 1-2 tablets (5-10 mg total) by mouth every 6 (six) hours as needed for moderate pain.   PRENATAL VIT-FE FUMARATE-FA (PRENATAL MULTIVITAMIN) TABS TABLET    Take 1 tablet by mouth daily at 12 noon.  Modified Medications   No medications on file  Discontinued Medications   No medications on file    Allergies: Allergies  Allergen Reactions  . Coconut Flavor Swelling    Throat swells    Past Medical History: Past Medical History:  Diagnosis Date  . Anxiety   . Depression   . History of gestational hypertension   . History of shingles   . Kidney stone     Social History: Social History   Socioeconomic History  . Marital status: Single    Spouse name: Not on file  . Number of children: Not on file  . Years of education: Not on file  . Highest education level: Not on file  Occupational History  . Not on file  Tobacco Use  . Smoking status: Former Smoker   Packs/day: 0.25    Quit date: 02/12/2014    Years since quitting: 5.8  . Smokeless tobacco: Never Used  Substance and Sexual Activity  . Alcohol use: Not Currently    Comment: hx alcoholism no alcohol in 4-5 years  . Drug use: No  . Sexual activity: Yes  Other Topics Concern  . Not on file  Social History Narrative  . Not on file   Social Determinants of Health   Financial Resource Strain: Low Risk   . Difficulty of Paying Living Expenses: Not hard at all  Food Insecurity: No Food Insecurity  . Worried About Programme researcher, broadcasting/film/video in the Last Year: Never true  . Ran Out of Food in the Last Year: Never true  Transportation Needs: Unknown  . Lack of Transportation (Medical): No  . Lack of Transportation (Non-Medical): Not on file  Physical Activity:   . Days of Exercise per Week: Not on file  . Minutes of Exercise per Session: Not on file  Stress: No Stress Concern Present  . Feeling of Stress : Not at all  Social Connections:   . Frequency of Communication with Friends and Family: Not on file  . Frequency of Social Gatherings with Friends and Family: Not on file  . Attends Religious Services: Not on file  . Active Member of Clubs or Organizations:  Not on file  . Attends Archivist Meetings: Not on file  . Marital Status: Not on file    Labs: Hepatitis C Lab Results  Component Value Date   HCVGENOTYPE 3 10/08/2019   HCVRNAPCRQN <15 DETECTED (A) 11/26/2019   HCVRNAPCRQN 7,140,000 (H) 10/08/2019   FIBROSTAGE F0 09/07/2019   Hepatitis B Lab Results  Component Value Date   HEPBSAB REACTIVE (A) 09/07/2019   HEPBSAG NON-REACTIVE 09/07/2019   HEPBCAB NON-REACTIVE 09/07/2019   Hepatitis A Lab Results  Component Value Date   HAV NON-REACTIVE 09/07/2019   HIV Lab Results  Component Value Date   HIV NON-REACTIVE 09/07/2019   HIV Non-reactive 07/15/2018   HIV Non-reactive 05/09/2014   HIV NON REACTIVE 12/24/2010   Lab Results  Component Value Date    CREATININE 0.84 11/26/2019   CREATININE 0.82 09/07/2019   CREATININE 0.60 12/24/2010   CREATININE 0.45 11/13/2010   CREATININE 0.71 05/04/2010   Lab Results  Component Value Date   AST 20 11/26/2019   AST 34 (H) 09/07/2019   AST 27 12/24/2010   ALT 16 11/26/2019   ALT 41 (H) 09/07/2019   ALT 40 (H) 09/07/2019   INR 1.0 09/07/2019    Assessment: Sonya Flores is here today for EOT Mavyret. She states that her fatigue/headaches got much better after switching to night time dosing. She reported taking her last dose in early March and having only missed one dose early on during her treatment.     Plan: 1) f/u in 12 weeks for SVR testing  Ned Clines Student Pharmacist, Class of [phone] for Infectious Disease 12/27/2019, 12:14 PM

## 2019-12-30 LAB — HEPATITIS C RNA QUANTITATIVE
HCV Quantitative Log: <1.18 Log IU/mL
HCV RNA, PCR, QN: <15 IU/mL

## 2020-03-27 ENCOUNTER — Telehealth: Payer: Self-pay | Admitting: Pharmacy Technician

## 2020-03-27 ENCOUNTER — Ambulatory Visit: Payer: Medicaid Other | Admitting: Pharmacist

## 2020-03-27 NOTE — Telephone Encounter (Signed)
[facility] Patient Advocate Encounter    Findings of the benefits investigation:   Insurance: [employer] remains active  Refills picked up: 11/10/2019 10/18/2019  Completion of HepC therapy.   Beulah Gandy, CPhT Specialty Pharmacy Patient [facility] Phone: [phone] Fax: [phone] 03/27/2020 8:35 AM

## 2021-09-10 IMAGING — US US ABDOMEN LIMITED W/ ELASTOGRAPHY
2 series · 12 of 25 positions shown · non-contrast
Comparison: None.

CLINICAL DATA: Chronic hepatitis C

EXAM:
US ABDOMEN LIMITED - RIGHT UPPER QUADRANT
ULTRASOUND HEPATIC ELASTOGRAPHY
TECHNIQUE: Sonography of the right upper quadrant was performed. In addition,
ultrasound elastography evaluation of the liver was performed. A
region of interest was placed within the right lobe of the liver.
Following application of a compressive sonographic pulse, tissue
compressibility was assessed. Multiple assessments were performed at
the selected site. Median tissue compressibility was determined.
Previously, hepatic stiffness was assessed by shear wave velocity.
Based on recently published Society of Radiologists in Ultrasound
consensus article, reporting is now recommended to be performed in
the SI units of pressure (kiloPascals) representing hepatic
stiffness/elasticity. The obtained result is compared to the
published reference standards. (cACLD= compensated Advanced Chronic
Liver Disease)

[Series 1: us abdomen limited w/ elastography · 9 of 45 slices shown (1 of 2)]
[im 3/45]
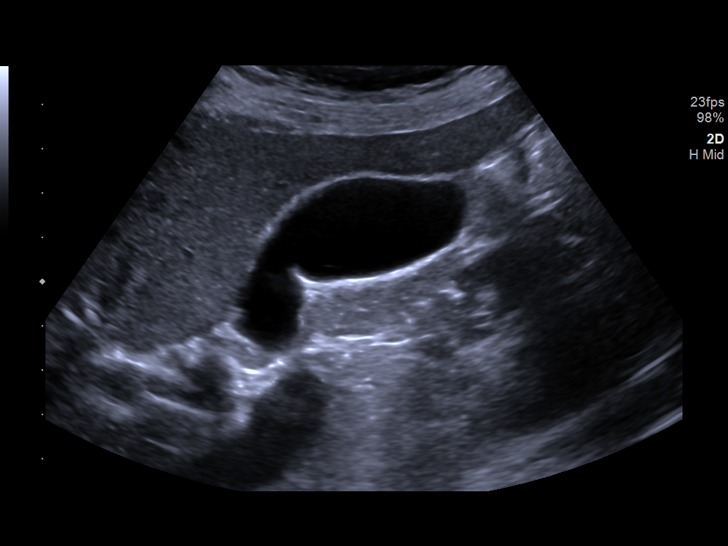
[im 8/45]
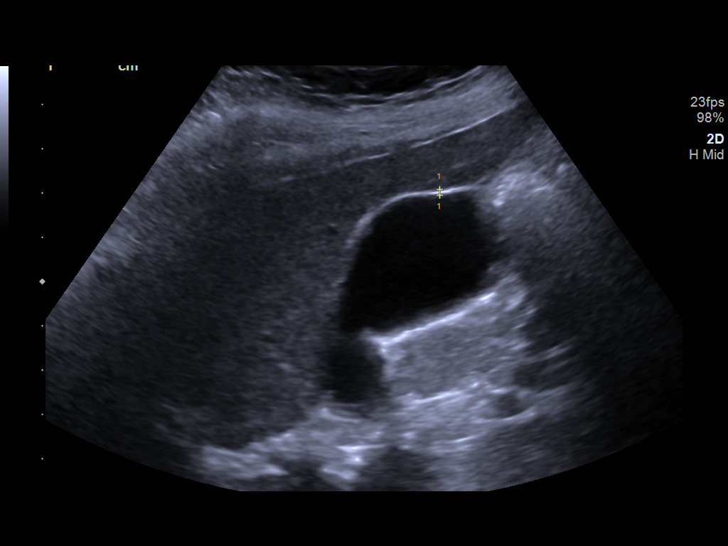
[im 13/45]
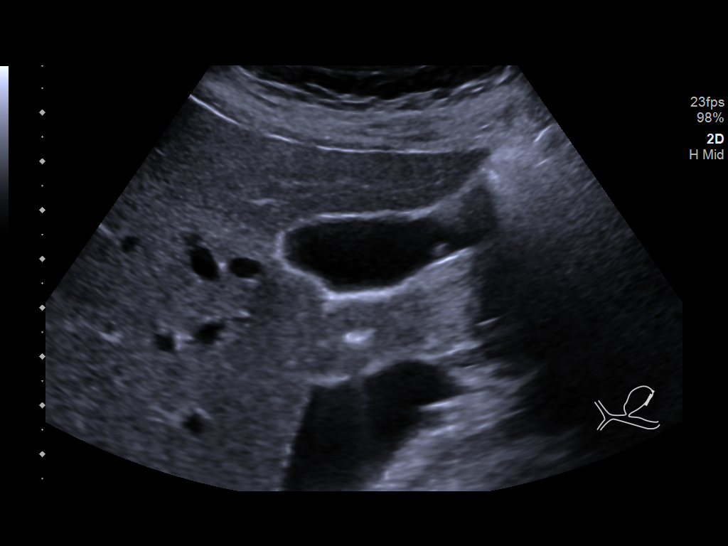
[im 18/45]
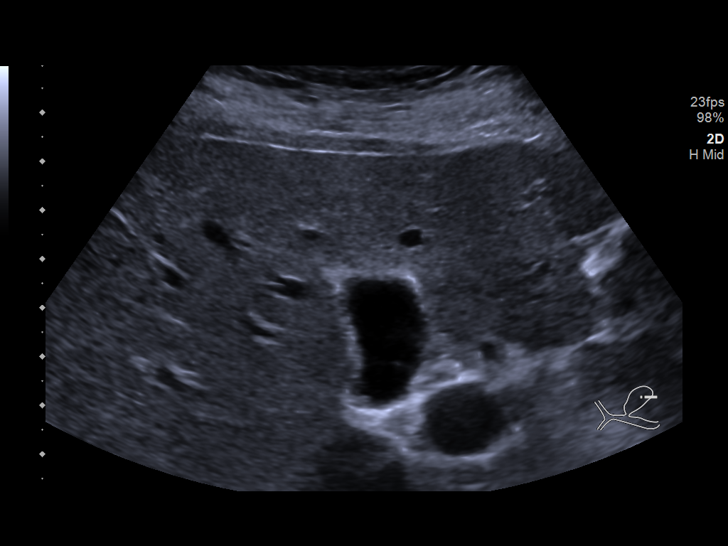
[im 23/45]
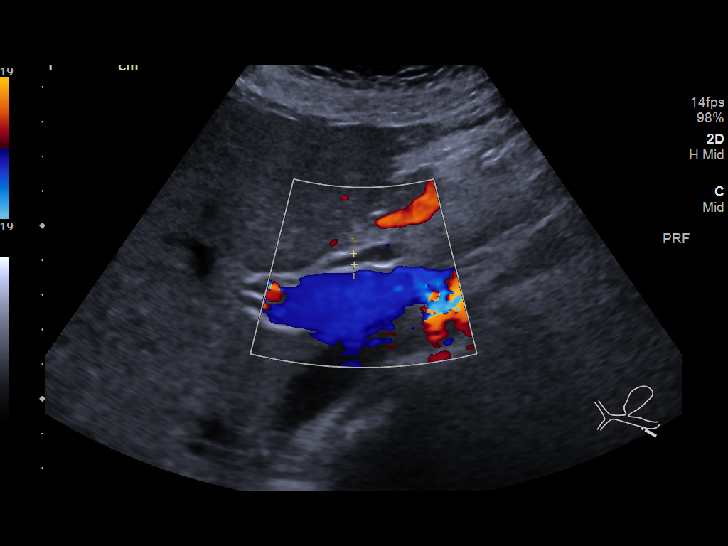
[im 27/45]
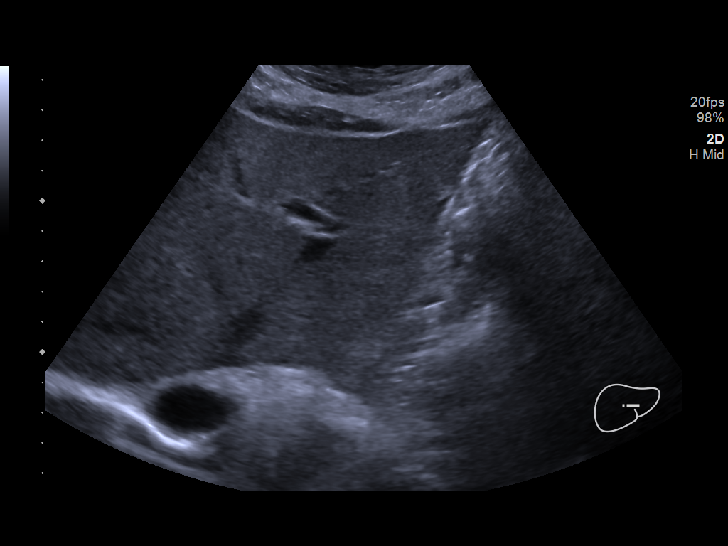
[im 32/45]
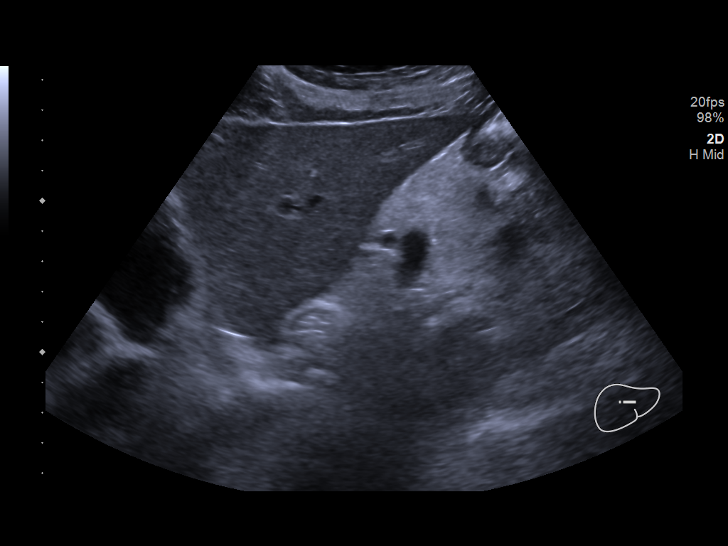
[im 37/45]
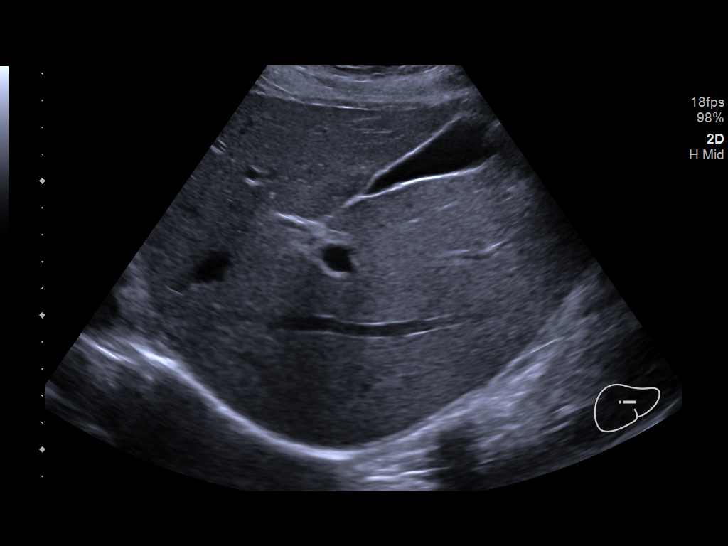
[im 42/45]
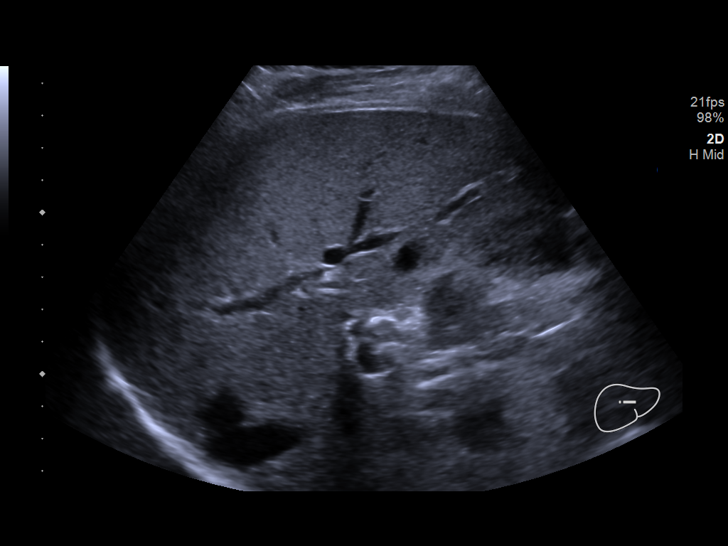

[Series 2: us abdomen limited w/ elastography · 3 of 14 slices shown (2 of 2)]
[im 1/14]
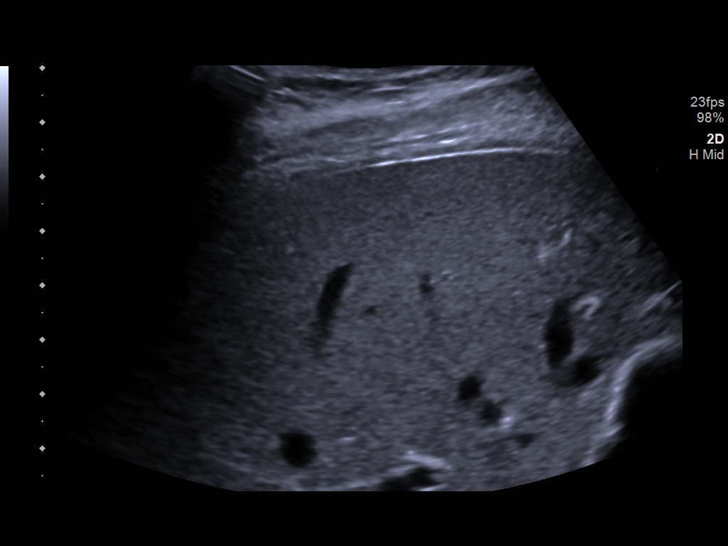
[im 6/14]
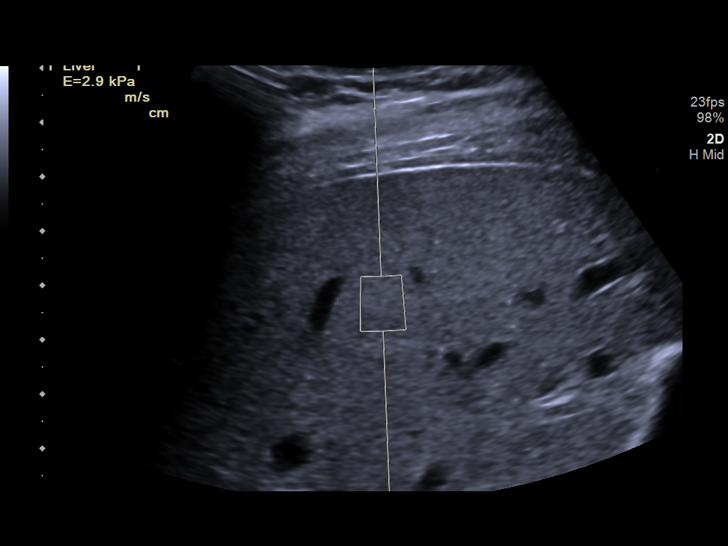
[im 11/14]
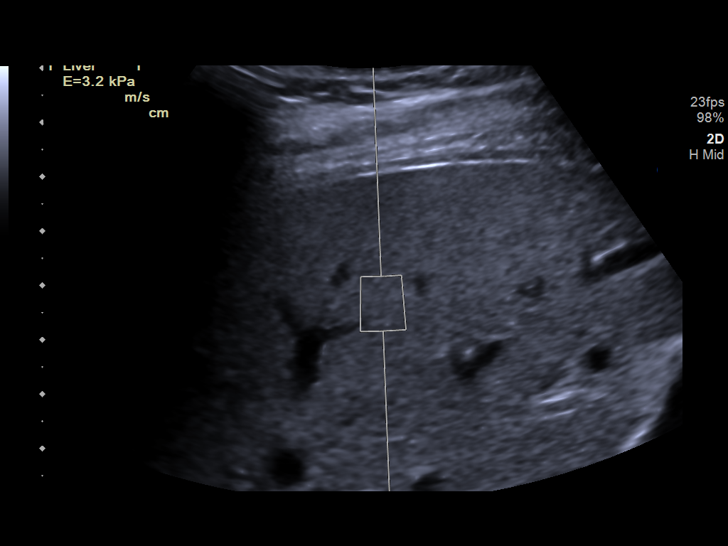

[12 of 25 positions shown; findings below may reference images not displayed]

FINDINGS: ULTRASOUND ABDOMEN LIMITED RIGHT UPPER QUADRANT

Gallbladder:

Layering 4 mm gallstone with gallbladder sludge. No gallbladder wall
thickening or pericholecystic fluid. Negative sonographic Murphy's
sign.

Common bile duct:

Diameter: 3 mm

Liver:

No focal lesion identified. Within normal limits in parenchymal
echogenicity. Portal vein is patent on color Doppler imaging with
normal direction of blood flow towards the liver.

ULTRASOUND HEPATIC ELASTOGRAPHY

Device: Siemens Helix VTQ

Patient position: Supine

Transducer 5C1

Number of measurements: 10

Hepatic segment:  8

Median kPa:

IQR:

IQR/Median kPa ratio:

Data quality:  Good

Diagnostic category:  ?5 kPa: high probability of being normal
IMPRESSION: ULTRASOUND RUQ:

4 mm layering gallstone, without sonographic findings to suggest
acute cholecystitis.

ULTRASOUND HEPATIC ELASTOGRAPHY:

Median kPa:

Diagnostic category:  ?5 kPa: high probability of being normal

The use of hepatic elastography is applicable to patients with viral
hepatitis and non-alcoholic fatty liver disease. At this time, there
is insufficient data for the referenced cut-off values and use in
other causes of liver disease, including alcoholic liver disease.
Patients, however, may be assessed by elastography and serve as
their own reference standard/baseline.

In patients with non-alcoholic liver disease, the values suggesting
compensated advanced chronic liver disease (cACLD) may be lower, and
patients may need additional testing with elasticity results of [DATE]
kPa.

Please note that abnormal hepatic elasticity and shear wave
velocities may also be identified in clinical settings other than
with hepatic fibrosis, such as: acute hepatitis, elevated right
heart and central venous pressures including use of beta blockers,
Robinich disease (Billiot), infiltrative processes such as
mastocytosis/amyloidosis/infiltrative tumor/lymphoma, extrahepatic
cholestasis, with hyperemia in the post-prandial state, and with
liver transplantation. Correlation with patient history, laboratory
data, and clinical condition recommended.

## 2024-11-12 ENCOUNTER — Other Ambulatory Visit: Payer: Self-pay | Admitting: Physician Assistant

## 2024-11-12 DIAGNOSIS — N63 Unspecified lump in unspecified breast: Secondary | ICD-10-CM

## 2024-11-19 ENCOUNTER — Ambulatory Visit
Admission: RE | Admit: 2024-11-19 | Discharge: 2024-11-19 | Disposition: A | Source: Ambulatory Visit | Attending: Physician Assistant | Admitting: Physician Assistant

## 2024-11-19 DIAGNOSIS — N63 Unspecified lump in unspecified breast: Secondary | ICD-10-CM

## 2024-11-22 ENCOUNTER — Encounter

## 2024-11-22 ENCOUNTER — Other Ambulatory Visit
